# Patient Record
Sex: Female | Born: 1960 | Race: White | Hispanic: No | Marital: Married | Smoking: Never smoker
Health system: Southern US, Community
[De-identification: ages and names within clinical notes are randomized; demographics above are authoritative.]

## PROBLEM LIST (undated history)

## (undated) DIAGNOSIS — J45909 Unspecified asthma, uncomplicated: Secondary | ICD-10-CM

## (undated) DIAGNOSIS — J302 Other seasonal allergic rhinitis: Secondary | ICD-10-CM

## (undated) DIAGNOSIS — M1990 Unspecified osteoarthritis, unspecified site: Secondary | ICD-10-CM

## (undated) DIAGNOSIS — I10 Essential (primary) hypertension: Secondary | ICD-10-CM

## (undated) DIAGNOSIS — M1711 Unilateral primary osteoarthritis, right knee: Secondary | ICD-10-CM

## (undated) DIAGNOSIS — H269 Unspecified cataract: Secondary | ICD-10-CM

## (undated) HISTORY — PX: OTHER SURGICAL HISTORY: SHX169

## (undated) HISTORY — DX: Unspecified cataract: H26.9

## (undated) HISTORY — DX: Unspecified osteoarthritis, unspecified site: M19.90

## (undated) HISTORY — PX: TUBAL LIGATION: SHX77

## (undated) HISTORY — DX: Essential (primary) hypertension: I10

## (undated) HISTORY — DX: Unspecified asthma, uncomplicated: J45.909

## (undated) HISTORY — DX: Other seasonal allergic rhinitis: J30.2

## (undated) HISTORY — DX: Unilateral primary osteoarthritis, right knee: M17.11

---

## 2013-11-07 DIAGNOSIS — M1711 Unilateral primary osteoarthritis, right knee: Secondary | ICD-10-CM | POA: Insufficient documentation

## 2013-11-07 HISTORY — DX: Unilateral primary osteoarthritis, right knee: M17.11

## 2016-11-16 ENCOUNTER — Telehealth: Payer: Self-pay

## 2016-11-16 NOTE — Telephone Encounter (Signed)
Completed Pre-visit information with patient.

## 2016-11-16 NOTE — Telephone Encounter (Signed)
Pre visit call attempted. Left message for return call. 

## 2016-11-17 ENCOUNTER — Ambulatory Visit (INDEPENDENT_AMBULATORY_CARE_PROVIDER_SITE_OTHER): Payer: Self-pay | Admitting: Family Medicine

## 2016-11-17 ENCOUNTER — Encounter: Payer: Self-pay | Admitting: Family Medicine

## 2016-11-17 VITALS — BP 132/92 | HR 65 | Temp 98.9°F | Ht 62.0 in | Wt 177.2 lb

## 2016-11-17 DIAGNOSIS — R030 Elevated blood-pressure reading, without diagnosis of hypertension: Secondary | ICD-10-CM

## 2016-11-17 DIAGNOSIS — R079 Chest pain, unspecified: Secondary | ICD-10-CM

## 2016-11-17 NOTE — Progress Notes (Signed)
Chief Complaint  Patient presents with  . Establish Care    Monica Lynch is a 56 y.o. female here for evaluation of chest pain. She is a new patient. She has never seen a cardiologist.  Duration of issue: 2 months  Quality: sharp and pressure Palliation: She will take an asa when it happens; nothing in particular makes it better Provocation: Sometimes when she physically exerting herself; usually when she is doing nothing. Once it starts, nothing makes it worse. Severity: 8/10 Radiation: L arm Duration of chest pain: 2 minutes Associated symptoms: None Cardiac history: None that she is aware of Family heart history: mom, dad, brother died of MI at 8242 Smoker? No; exposed to 2nd hand smoke throughout childhood, people still smoke at work  ROS:  Cardiac: No current chest pain Lungs: No SOB  Past Medical History:  Diagnosis Date  . Asthma    osteoarthitis  . Cataract    current early stage   Family History  Problem Relation Age of Onset  . Heart disease Mother   . COPD Father    Social History   Social History  . Marital status: Married   Social History Main Topics  . Smoking status: Never Smoker  . Smokeless tobacco: Never Used  . Alcohol use Yes     Comment: occasion  . Drug use: No   Allergies as of 11/17/2016   No Known Allergies     Medication List    as of 11/17/2016  2:33 PM   You have not been prescribed any medications.          Discharge Care Instructions        Start     Ordered   11/17/16 0000  EKG 12-Lead     11/17/16 1413   11/17/16 0000  Ambulatory referral to Cardiology    Comments:  Prefers Fridays   11/17/16 1427      BP (!) 132/92 (BP Location: Left Arm, Patient Position: Sitting, Cuff Size: Normal)   Pulse 65   Temp 98.9 F (37.2 C) (Oral)   Ht 5\' 2"  (1.575 m)   Wt 177 lb 4 oz (80.4 kg)   SpO2 97%   BMI 32.42 kg/m  Gen: awake, alert, appears stated age HEENT: PERRLA, MMM Neck: No masses or asymmetry  Heart: RRR, no  bruits, no LE edema Lungs: CTAB, no accessory muscle use Abd: Soft, NT, ND, no masses or organomegaly MSK: chest pain is not reproducible to palptation Psych: Age appropriate judgment and insight, nml mood and affect  Chest pain, unspecified type - Plan: EKG 12-Lead, Ambulatory referral to Cardiology, CANCELED: Ambulatory referral to Cardiology  Elevated blood pressure reading  Orders as above. EKG nml. Refer to cardiology for hopeful stress test. Discussed s/s's where she should seek emergent care and provided in writing. DASH plan for BP. Pt due for health maintenance issues, but she does not have any insurance. Will address at future date.  F/u prn. The patient voiced understanding and agreement to the plan.  Jilda Rocheicholas Paul [place], DO 11/17/16 2:33 PM

## 2016-11-17 NOTE — Progress Notes (Signed)
Pre visit review using our clinic review tool, if applicable. No additional management support is needed unless otherwise documented below in the visit note. 

## 2016-11-17 NOTE — Patient Instructions (Addendum)
If you do not hear anything about your referral in the next week, call our office and ask for an update.  If the chest pain becomes more frequent, lasts longer (10-15 min), or you have new symptoms, consider calling 911 or going to ER.   Take an aspirin daily.   DASH Eating Plan DASH stands for "Dietary Approaches to Stop Hypertension." The DASH eating plan is a healthy eating plan that has been shown to reduce high blood pressure (hypertension). It may also reduce your risk for type 2 diabetes, heart disease, and stroke. The DASH eating plan may also help with weight loss. What are tips for following this plan? General guidelines  Avoid eating more than 2,300 mg (milligrams) of salt (sodium) a day. If you have hypertension, you may need to reduce your sodium intake to 1,500 mg a day.  Limit alcohol intake to no more than 1 drink a day for nonpregnant women and 2 drinks a day for men. One drink equals 12 oz of beer, 5 oz of wine, or 1 oz of hard liquor.  Work with your health care provider to maintain a healthy body weight or to lose weight. Ask what an ideal weight is for you.  Get at least 30 minutes of exercise that causes your heart to beat faster (aerobic exercise) most days of the week. Activities may include walking, swimming, or biking.  Work with your health care provider or diet and nutrition specialist (dietitian) to adjust your eating plan to your individual calorie needs. Reading food labels  Check food labels for the amount of sodium per serving. Choose foods with less than 5 percent of the Daily Value of sodium. Generally, foods with less than 300 mg of sodium per serving fit into this eating plan.  To find whole grains, look for the word "whole" as the first word in the ingredient list. Shopping  Buy products labeled as "low-sodium" or "no salt added."  Buy fresh foods. Avoid canned foods and premade or frozen meals. Cooking  Avoid adding salt when cooking. Use  salt-free seasonings or herbs instead of table salt or sea salt. Check with your health care provider or pharmacist before using salt substitutes.  Do not fry foods. Cook foods using healthy methods such as baking, boiling, grilling, and broiling instead.  Cook with heart-healthy oils, such as olive, canola, soybean, or sunflower oil. Meal planning   Eat a balanced diet that includes: ? 5 or more servings of fruits and vegetables each day. At each meal, try to fill half of your plate with fruits and vegetables. ? Up to 6-8 servings of whole grains each day. ? Less than 6 oz of lean meat, poultry, or fish each day. A 3-oz serving of meat is about the same size as a deck of cards. One egg equals 1 oz. ? 2 servings of low-fat dairy each day. ? A serving of nuts, seeds, or beans 5 times each week. ? Heart-healthy fats. Healthy fats called Omega-3 fatty acids are found in foods such as flaxseeds and coldwater fish, like sardines, salmon, and mackerel.  Limit how much you eat of the following: ? Canned or prepackaged foods. ? Food that is high in trans fat, such as fried foods. ? Food that is high in saturated fat, such as fatty meat. ? Sweets, desserts, sugary drinks, and other foods with added sugar. ? Full-fat dairy products.  Do not salt foods before eating.  Try to eat at least 2 vegetarian meals  each week.  Eat more home-cooked food and less restaurant, buffet, and fast food.  When eating at a restaurant, ask that your food be prepared with less salt or no salt, if possible. What foods are recommended? The items listed may not be a complete list. Talk with your dietitian about what dietary choices are best for you. Grains Whole-grain or whole-wheat bread. Whole-grain or whole-wheat pasta. Brown rice. Orpah Cobbatmeal. Quinoa. Bulgur. Whole-grain and low-sodium cereals. Pita bread. Low-fat, low-sodium crackers. Whole-wheat flour tortillas. Vegetables Fresh or frozen vegetables (raw, steamed,  roasted, or grilled). Low-sodium or reduced-sodium tomato and vegetable juice. Low-sodium or reduced-sodium tomato sauce and tomato paste. Low-sodium or reduced-sodium canned vegetables. Fruits All fresh, dried, or frozen fruit. Canned fruit in natural juice (without added sugar). Meat and other protein foods Skinless chicken or Malawiturkey. Ground chicken or Mal[place]iturkey. Pork with fat trimmed off. Fish and seafood. Egg whites. Dried beans, peas, or lentils. Unsalted nuts, nut butters, and seeds. Unsalted canned beans. Lean cuts of beef with fat trimmed off. Low-sodium, lean deli meat. Dairy Low-fat (1%) or fat-free (skim) milk. Fat-free, low-fat, or reduced-fat cheeses. Nonfat, low-sodium ricotta or cottage cheese. Low-fat or nonfat yogurt. Low-fat, low-sodium cheese. Fats and oils Soft margarine without trans fats. Vegetable oil. Low-fat, reduced-fat, or light mayonnaise and salad dressings (reduced-sodium). Canola, safflower, olive, soybean, and sunflower oils. Avocado. Seasoning and other foods Herbs. Spices. Seasoning mixes without salt. Unsalted popcorn and pretzels. Fat-free sweets. What foods are not recommended? The items listed may not be a complete list. Talk with your dietitian about what dietary choices are best for you. Grains Baked goods made with fat, such as croissants, muffins, or some breads. Dry pasta or rice meal packs. Vegetables Creamed or fried vegetables. Vegetables in a cheese sauce. Regular canned vegetables (not low-sodium or reduced-sodium). Regular canned tomato sauce and paste (not low-sodium or reduced-sodium). Regular tomato and vegetable juice (not low-sodium or reduced-sodium). Rosita FirePickles. Olives. Fruits Canned fruit in a light or heavy syrup. Fried fruit. Fruit in cream or butter sauce. Meat and other protein foods Fatty cuts of meat. Ribs. Fried meat. Tomasa BlaseBacon. Sausage. Bologna and other processed lunch meats. Salami. Fatback. Hotdogs. Bratwurst. Salted nuts and seeds. Canned  beans with added salt. Canned or smoked fish. Whole eggs or egg yolks. Chicken or Malawiturkey with skin. Dairy Whole or 2% milk, cream, and half-and-half. Whole or full-fat cream cheese. Whole-fat or sweetened yogurt. Full-fat cheese. Nondairy creamers. Whipped toppings. Processed cheese and cheese spreads. Fats and oils Butter. Stick margarine. Lard. Shortening. Ghee. Bacon fat. Tropical oils, such as coconut, palm kernel, or palm oil. Seasoning and other foods Salted popcorn and pretzels. Onion salt, garlic salt, seasoned salt, table salt, and sea salt. Worcestershire sauce. Tartar sauce. Barbecue sauce. Teriyaki sauce. Soy sauce, including reduced-sodium. Steak sauce. Canned and packaged gravies. Fish sauce. Oyster sauce. Cocktail sauce. Horseradish that you find on the shelf. Ketchup. Mustard. Meat flavorings and tenderizers. Bouillon cubes. Hot sauce and Tabasco sauce. Premade or packaged marinades. Premade or packaged taco seasonings. Relishes. Regular salad dressings. Where to find more information:  National Heart, Lung, and Blood Institute: PopSteam.iswww.nhlbi.nih.gov  American Heart Association: www.heart.org Summary  The DASH eating plan is a healthy eating plan that has been shown to reduce high blood pressure (hypertension). It may also reduce your risk for type 2 diabetes, heart disease, and stroke.  With the DASH eating plan, you should limit salt (sodium) intake to 2,300 mg a day. If you have hypertension, you may need to reduce  your sodium intake to 1,500 mg a day.  When on the DASH eating plan, aim to eat more fresh fruits and vegetables, whole grains, lean proteins, low-fat dairy, and heart-healthy fats.  Work with your health care provider or diet and nutrition specialist (dietitian) to adjust your eating plan to your individual calorie needs. This information is not intended to replace advice given to you by your health care provider. Make sure you discuss any questions you have with your  health care provider. Document Released: 02/16/2011 Document Revised: 02/21/2016 Document Reviewed: 02/21/2016 Elsevier Interactive Patient Education  2017 [employer].

## 2016-11-20 ENCOUNTER — Encounter: Payer: Self-pay | Admitting: Family Medicine

## 2016-12-01 ENCOUNTER — Encounter: Payer: Self-pay | Admitting: Cardiology

## 2016-12-01 ENCOUNTER — Ambulatory Visit (INDEPENDENT_AMBULATORY_CARE_PROVIDER_SITE_OTHER): Payer: Self-pay | Admitting: Cardiology

## 2016-12-01 ENCOUNTER — Other Ambulatory Visit: Payer: Self-pay

## 2016-12-01 ENCOUNTER — Telehealth: Payer: Self-pay

## 2016-12-01 DIAGNOSIS — R0609 Other forms of dyspnea: Secondary | ICD-10-CM | POA: Insufficient documentation

## 2016-12-01 DIAGNOSIS — I10 Essential (primary) hypertension: Secondary | ICD-10-CM

## 2016-12-01 DIAGNOSIS — R0789 Other chest pain: Secondary | ICD-10-CM | POA: Insufficient documentation

## 2016-12-01 HISTORY — DX: Essential (primary) hypertension: I10

## 2016-12-01 MED ORDER — ASPIRIN EC 81 MG PO TBEC: 81.0000 mg | DELAYED_RELEASE_TABLET | Freq: Every day | ORAL | 3 refills | Status: AC

## 2016-12-01 MED ORDER — NITROGLYCERIN 0.4 MG SL SUBL: 0.4000 mg | SUBLINGUAL_TABLET | SUBLINGUAL | 1 refills | Status: DC | PRN

## 2016-12-01 MED ORDER — METOPROLOL TARTRATE 25 MG PO TABS: 25.0000 mg | ORAL_TABLET | Freq: Two times a day (BID) | ORAL | 3 refills | Status: DC

## 2016-12-01 NOTE — Patient Instructions (Addendum)
Medication Instructions:  Your physician has recommended you make the following change in your medication:  1.) START taking metoprolol 25 mg twice daily.  2.) START taking Aspirin 81 mg daily.  3.) START Nitroglycerin 0.4 mg sublingual AS NEEDED for chest pain. The proper use and anticipated side effects of nitroglycerine has been carefully explained.  If a single episode of chest pain is not relieved by one tablet, the patient will try another within 5 minutes; and if this doesn't relieve the pain, the patient is instructed to call 911 for transportation to an emergency department.  Labwork: Your physician recommends that you return for a FASTING lipid profile: Today in office  Testing/Procedures: Your physician has requested that you have en exercise stress myoview. For further information please visit [email or web address] Please follow instruction sheet, as given.  Please report to [address], [address] [place], [place] the day of your testing.   Follow-Up: Your physician recommends that you schedule a follow-up appointment in: 1 month   Any Other Special Instructions Will Be Listed Below (If Applicable).  Please note that any paperwork needing to be filled out by the provider will need to be addressed at the front desk prior to seeing the provider. Please note that any paperwork FMLA, Disability or other documents regarding health condition is subject to a $25.00 charge that must be received prior to completion of paperwork in the form of a money order or check.     If you need a refill on your cardiac medications before your next appointment, please call your pharmacy.

## 2016-12-01 NOTE — Telephone Encounter (Signed)
patient pharmacy did not get lopressor please [email or web address]

## 2016-12-01 NOTE — Progress Notes (Signed)
Cardiology Consultation:    Date:  12/01/2016   ID:  Monica Lynch, DOB 09/19/1960, MRN [id number]  PCP:  Sharlene Dory, DO  Cardiologist:  Gypsy Balsam, MD   Referring MD: Sharlene Dory*   Chief Complaint  Patient presents with  . Chest Pain    Hits all of a sudden, feels like an elephant on her chest with arm pain/shoulder. No episodes since seeing PCP  I'm having chest  History of Present Illness:    Monica Lynch is a 56 y.o. female who is being seen today for the evaluation of chest pain at the request of Sharlene Dory*. For last few weeks she's being experiencing chest pain. She described chest pain as heaviness in the chest. It happens in different situations it can happen when she is laying down and sitting up will help. It also happens when she does things. However I cannot get clear-cut answe from her if it's inducible provoked by exercise. She tells me when she walks she does not pay much attention to her symptoms. There is no sweating there is no shortness of breath associated with it. I risk factors for coronary artery disease include family history of premature coronary artery disease, hypertension, cholesteol status is unknown. She never smoked but she is being exposed to second hand smoking for her life  Past Medical History:  Diagnosis Date  . Asthma   . Cataract    current early stage  . Essential hypertension 12/01/2016  . Osteoarthritis   . Seasonal allergies     Past Surgical History:  Procedure Laterality Date  . screws removed from ankles    . TUBAL LIGATION      Current Medications: No outpatient prescriptions have been marked as taking for the 12/01/16 encounter (Office Visit) with Georgeanna Lea, MD.     Allergies:   Patient has no known allergies.   Social History   Social History  . Marital status: Married    Spouse name: N/A  . Number of children: N/A  . Years of education: N/A   Social History  Main Topics  . Smoking status: Never Smoker  . Smokeless tobacco: Never Used  . Alcohol use Yes     Comment: occasional  . Drug use: No  . Sexual activity: Not Asked   Other Topics Concern  . None   Social History Narrative  . None     Family History: The patient's family history includes COPD in her father; Heart attack (age of onset: 34) in her brother; Heart disease in her mother; Hyperlipidemia in her father, maternal grandfather, and mother; Hypertension in her mother. ROS:   Please see the history of present illness.    All 14 point review of systems negative except as described per history of present illness.  EKGs/Labs/Other Studies Reviewed:    The following studies were reviewed today:   EKG:  EKG is  ordered today.  The ekg ordered today demonstrates   Recent Labs: No results found for requested labs within last 8760 hours.  Recent Lipid Panel No results found for: CHOL, TRIG, HDL, CHOLHDL, VLDL, LDLCALC, LDLDIRECT  Physical Exam:    VS:  BP (!) 136/94   Pulse 76   Resp 10   Ht  (1.575 m)   Wt 175 lb 6.4 oz (79.6 kg)   BMI 32.08 kg/m     Wt Readings from Last 3 Encounters:  12/01/16 175 lb 6.4 oz (79.6 kg)  11/17/16 177  lb 4 oz (80.4 kg)     GEN:  Well nourished, well developed in no acute distress HEENT: Normal NECK: No JVD; No carotid bruits LYMPHATICS: No lymphadenopathy CARDIAC: RRR, no murmurs, no rubs, no gallops RESPIRATORY:  Clear to auscultation without rales, wheezing or rhonchi  ABDOMEN: Soft, non-tender, non-distended MUSCULOSKELETAL:  No edema; No deformity  SKIN: Warm and dry NEUROLOGIC:  Alert and oriented x 3 PSYCHIATRIC:  Normal affect   ASSESSMENT:    1. Atypical chest pain   2. Essential hypertension   3. Dyspnea on exertion    PLAN:    In order of problems listed above:  1. Atypical chest pain: Will ask her to start taking one baby aspirin every single day,I will give her prescription for nitroglycerin and  instructions how to take it.I will also give her metoprolol 25 twice daily. She will be scheduled to  Have exercise Cardiolite. 2. Essential hypertension: She is being never diagnosed with it however she described to have elevated blood pressurehe time. I will give her metoprolol to help with her symptoms it should also help with her blood pressure. 3 Dyspnea with exertion: We'll do stress test to assess it 4. Her daughter Anselm Jungling is a Engineer, civil (consulting) maid some interesting observation, when patient was having chest pain she checked her pulse which was irregular. This is something that may need to be investigated ine future.   Medication Adjustments/Labs and Tests Ordered: Current medicines are reviewed at length with the patient today.  Concerns regarding medicines are outlined above.  No orders of the defined types were placed in this encounter.  No orders of the defined types were placed in this encounter.   Signed, Georgeanna Lea, MD, [facility]. 12/01/2016 9:17 AM    [facility]

## 2016-12-01 NOTE — Telephone Encounter (Signed)
Resent the medication

## 2016-12-02 LAB — LIPID PANEL
CHOL/HDL RATIO: 4.3 ratio (ref 0.0–4.4)
Cholesterol, Total: 213 mg/dL — ABNORMAL HIGH (ref 100–199)
HDL: 50 mg/dL (ref >39)
LDL Calculated: 137 mg/dL — ABNORMAL HIGH (ref 0–99)
Triglycerides: 129 mg/dL (ref 0–149)
VLDL CHOLESTEROL CAL: 26 mg/dL (ref 5–40)

## 2016-12-05 ENCOUNTER — Telehealth (HOSPITAL_COMMUNITY): Payer: Self-pay | Admitting: *Deleted

## 2016-12-05 NOTE — Telephone Encounter (Signed)
Left message on voicemail per DPR in reference to upcoming appointment scheduled on 12/08/16 at 0745 with detailed instructions given per Myocardial Perfusion Study Information Sheet for the test. LM to arrive 15 minutes early, and that it is imperative to arrive on time for appointment to keep from having the test rescheduled. If you need to cancel or reschedule your appointment, please call the office within 24 hours of your appointment. Failure to do so may result in a cancellation of your appointment, and a $50 no show fee. Phone number given for call back for any questions.

## 2016-12-08 ENCOUNTER — Other Ambulatory Visit: Payer: Self-pay

## 2016-12-08 ENCOUNTER — Emergency Department (HOSPITAL_COMMUNITY): Payer: Self-pay

## 2016-12-08 ENCOUNTER — Encounter (HOSPITAL_COMMUNITY): Payer: Self-pay | Admitting: *Deleted

## 2016-12-08 ENCOUNTER — Encounter (HOSPITAL_COMMUNITY): Payer: Self-pay

## 2016-12-08 ENCOUNTER — Other Ambulatory Visit (HOSPITAL_COMMUNITY): Payer: Self-pay

## 2016-12-08 ENCOUNTER — Ambulatory Visit (INDEPENDENT_AMBULATORY_CARE_PROVIDER_SITE_OTHER): Payer: Self-pay | Admitting: Internal Medicine

## 2016-12-08 ENCOUNTER — Inpatient Hospital Stay (HOSPITAL_COMMUNITY)
Admission: EM | Admit: 2016-12-08 | Discharge: 2016-12-17 | DRG: 234 | Disposition: A | Discharge status: Home / Self Care | Payer: Self-pay | Attending: Cardiothoracic Surgery | Admitting: Cardiothoracic Surgery

## 2016-12-08 ENCOUNTER — Ambulatory Visit (HOSPITAL_COMMUNITY): Payer: Self-pay | Attending: Cardiovascular Disease

## 2016-12-08 VITALS — BP 132/92 | HR 62 | Ht 62.0 in | Wt 175.0 lb

## 2016-12-08 DIAGNOSIS — R0789 Other chest pain: Secondary | ICD-10-CM | POA: Insufficient documentation

## 2016-12-08 DIAGNOSIS — Z01818 Encounter for other preprocedural examination: Secondary | ICD-10-CM

## 2016-12-08 DIAGNOSIS — R0609 Other forms of dyspnea: Secondary | ICD-10-CM | POA: Insufficient documentation

## 2016-12-08 DIAGNOSIS — R9439 Abnormal result of other cardiovascular function study: Secondary | ICD-10-CM

## 2016-12-08 DIAGNOSIS — I10 Essential (primary) hypertension: Secondary | ICD-10-CM | POA: Diagnosis present

## 2016-12-08 DIAGNOSIS — E8770 Fluid overload, unspecified: Secondary | ICD-10-CM | POA: Diagnosis not present

## 2016-12-08 DIAGNOSIS — E669 Obesity, unspecified: Secondary | ICD-10-CM | POA: Diagnosis present

## 2016-12-08 DIAGNOSIS — Z7982 Long term (current) use of aspirin: Secondary | ICD-10-CM

## 2016-12-08 DIAGNOSIS — M1990 Unspecified osteoarthritis, unspecified site: Secondary | ICD-10-CM | POA: Diagnosis present

## 2016-12-08 DIAGNOSIS — R001 Bradycardia, unspecified: Secondary | ICD-10-CM | POA: Diagnosis not present

## 2016-12-08 DIAGNOSIS — Z79899 Other long term (current) drug therapy: Secondary | ICD-10-CM

## 2016-12-08 DIAGNOSIS — Z951 Presence of aortocoronary bypass graft: Secondary | ICD-10-CM

## 2016-12-08 DIAGNOSIS — I472 Ventricular tachycardia: Secondary | ICD-10-CM | POA: Diagnosis present

## 2016-12-08 DIAGNOSIS — I200 Unstable angina: Secondary | ICD-10-CM | POA: Diagnosis present

## 2016-12-08 DIAGNOSIS — I2510 Atherosclerotic heart disease of native coronary artery without angina pectoris: Secondary | ICD-10-CM | POA: Diagnosis present

## 2016-12-08 DIAGNOSIS — I25110 Atherosclerotic heart disease of native coronary artery with unstable angina pectoris: Principal | ICD-10-CM | POA: Diagnosis present

## 2016-12-08 DIAGNOSIS — Z8249 Family history of ischemic heart disease and other diseases of the circulatory system: Secondary | ICD-10-CM

## 2016-12-08 DIAGNOSIS — I209 Angina pectoris, unspecified: Secondary | ICD-10-CM

## 2016-12-08 DIAGNOSIS — Z6831 Body mass index (BMI) 31.0-31.9, adult: Secondary | ICD-10-CM

## 2016-12-08 DIAGNOSIS — I208 Other forms of angina pectoris: Secondary | ICD-10-CM

## 2016-12-08 DIAGNOSIS — D62 Acute posthemorrhagic anemia: Secondary | ICD-10-CM | POA: Diagnosis not present

## 2016-12-08 LAB — MYOCARDIAL PERFUSION IMAGING
CHL CUP MPHR: 166 {beats}/min
CHL CUP STRESS STAGE 1 DBP: 92 mmHg
CHL CUP STRESS STAGE 1 GRADE: 0 %
CHL CUP STRESS STAGE 1 HR: 80 {beats}/min
CHL CUP STRESS STAGE 1 SBP: 132 mmHg
CHL CUP STRESS STAGE 2 DBP: 92 mmHg
CHL CUP STRESS STAGE 3 GRADE: 0.1 %
CHL CUP STRESS STAGE 4 GRADE: 10 %
CHL CUP STRESS STAGE 4 HR: 123 {beats}/min
CHL CUP STRESS STAGE 5 GRADE: 12 %
CHL CUP STRESS STAGE 5 HR: 142 {beats}/min
CHL CUP STRESS STAGE 5 SPEED: 2.5 mph
CHL CUP STRESS STAGE 6 DBP: 76 mmHg
CHL CUP STRESS STAGE 6 HR: 96 {beats}/min
CHL CUP STRESS STAGE 6 SBP: 90 mmHg
CHL CUP STRESS STAGE 6 SPEED: 0 mph
CHL CUP STRESS STAGE 7 SPEED: 0 mph
CSEPED: 5 min
CSEPEW: 7 METS
CSEPHR: 87 %
CSEPPHR: 142 {beats}/min
Exercise duration (sec): 47 s
LVDIAVOL: 104 mL (ref 46–106)
LVSYSVOL: 46 mL
Peak BP: 127 mmHg
Percent of predicted max HR: 85 %
RATE: 0.47
Rest HR: 61 {beats}/min
SDS: 10
SRS: 3
SSS: 13
Stage 1 Speed: 0 mph
Stage 2 Grade: 0 %
Stage 2 HR: 65 {beats}/min
Stage 2 SBP: 138 mmHg
Stage 2 Speed: 0 mph
Stage 3 HR: 65 {beats}/min
Stage 3 Speed: 0 mph
Stage 4 Speed: 1.7 mph
Stage 5 DBP: 87 mmHg
Stage 5 SBP: 127 mmHg
Stage 6 Grade: 0 %
Stage 7 Grade: 0 %
Stage 7 HR: 62 {beats}/min
TID: 1.25

## 2016-12-08 LAB — TROPONIN I

## 2016-12-08 LAB — CBC WITH DIFFERENTIAL/PLATELET
BASOS PCT: 0 %
Basophils Absolute: 0 10*3/uL (ref 0.0–0.1)
EOS ABS: 0.1 10*3/uL (ref 0.0–0.7)
Eosinophils Relative: 1 %
HCT: 42.1 % (ref 36.0–46.0)
HEMOGLOBIN: 14.3 g/dL (ref 12.0–15.0)
Lymphocytes Relative: 26 %
Lymphs Abs: 2 10*3/uL (ref 0.7–4.0)
MCH: 30.8 pg (ref 26.0–34.0)
MCHC: 34 g/dL (ref 30.0–36.0)
MCV: 90.5 fL (ref 78.0–100.0)
Monocytes Absolute: 0.3 10*3/uL (ref 0.1–1.0)
Monocytes Relative: 4 %
NEUTROS PCT: 69 %
Neutro Abs: 5.5 10*3/uL (ref 1.7–7.7)
PLATELETS: 279 10*3/uL (ref 150–400)
RBC: 4.65 MIL/uL (ref 3.87–5.11)
RDW: 12.4 % (ref 11.5–15.5)
WBC: 8 10*3/uL (ref 4.0–10.5)

## 2016-12-08 LAB — COMPREHENSIVE METABOLIC PANEL
ALK PHOS: 52 U/L (ref 38–126)
ALT: 15 U/L (ref 14–54)
ANION GAP: 8 (ref 5–15)
AST: 18 U/L (ref 15–41)
Albumin: 4.2 g/dL (ref 3.5–5.0)
BILIRUBIN TOTAL: 0.7 mg/dL (ref 0.3–1.2)
BUN: 10 mg/dL (ref 6–20)
CALCIUM: 9.3 mg/dL (ref 8.9–10.3)
CO2: 23 mmol/L (ref 22–32)
Chloride: 108 mmol/L (ref 101–111)
Creatinine, Ser: 0.61 mg/dL (ref 0.44–1.00)
Glucose, Bld: 85 mg/dL (ref 65–99)
Potassium: 3.9 mmol/L (ref 3.5–5.1)
Sodium: 139 mmol/L (ref 135–145)
TOTAL PROTEIN: 7.3 g/dL (ref 6.5–8.1)

## 2016-12-08 LAB — ABO/RH: ABO/RH(D): B POS

## 2016-12-08 LAB — TYPE AND SCREEN
ABO/RH(D): B POS
ANTIBODY SCREEN: NEGATIVE

## 2016-12-08 LAB — PROTIME-INR
INR: 0.99
PROTHROMBIN TIME: 13 s (ref 11.4–15.2)

## 2016-12-08 LAB — HEPARIN LEVEL (UNFRACTIONATED): Heparin Unfractionated: 0.29 IU/mL — ABNORMAL LOW (ref 0.30–0.70)

## 2016-12-08 MED ORDER — HEPARIN (PORCINE) IN NACL 100-0.45 UNIT/ML-% IJ SOLN
1050.0000 [IU]/h | INTRAMUSCULAR | Status: DC
  Administered 2016-12-08: 900 [IU]/h via INTRAVENOUS
  Administered 2016-12-09: 1050 [IU]/h via INTRAVENOUS
  Filled 2016-12-08 (×3): qty 250

## 2016-12-08 MED ORDER — HEPARIN BOLUS VIA INFUSION
4000.0000 [IU] | Freq: Once | INTRAVENOUS | Status: AC
  Administered 2016-12-08: 4000 [IU] via INTRAVENOUS
  Filled 2016-12-08: qty 4000

## 2016-12-08 MED ORDER — TECHNETIUM TC 99M TETROFOSMIN IV KIT
10.1000 | PACK | Freq: Once | INTRAVENOUS | Status: AC | PRN
  Administered 2016-12-08: 10.1 via INTRAVENOUS
  Filled 2016-12-08: qty 11

## 2016-12-08 MED ORDER — TECHNETIUM TC 99M TETROFOSMIN IV KIT
30.6000 | PACK | Freq: Once | INTRAVENOUS | Status: AC | PRN
  Administered 2016-12-08: 30.6 via INTRAVENOUS
  Filled 2016-12-08: qty 31

## 2016-12-08 NOTE — ED Notes (Signed)
Patient denies Chest pain and SOB and is resting comfortably. NAD noted.  Family is at bedside.  Remains on heparin gtts and is infusing well.  Will continue to monitor for any needs or changes

## 2016-12-08 NOTE — ED Provider Notes (Signed)
Dr. Sherryl Manges has called regarding patient management. Patient had abnormal stress test today. She is sent to the emergency department with planned admission to cardiology. Patient is to be started on ACS heparin.   Arby Barrette, MD 12/08/16 [phone]

## 2016-12-08 NOTE — ED Provider Notes (Signed)
Medical screening examination/treatment/procedure(s) were conducted as a shared visit with non-physician practitioner(s) and myself.  I personally evaluated the patient during the encounter.   EKG Interpretation  Date/Time:  Friday December 08 2016 12:13:45 EDT Ventricular Rate:  61 PR Interval:  160 QRS Duration: 80 QT Interval:  406 QTC Calculation: 408 R Axis:   83 Text Interpretation:  Normal sinus rhythm Normal ECG no acute changes  Confirmed by [name] [phone]) on 12/08/2016 1:05:61 PM      56 year old female with history of HTN who presents with abnormal stress test. Reports intermittent chest pressure, dyspnea at rest and with activity for past few months. Seen by cardiology for stress ECHO today, and abnormal. Sent by Dr. Graciela Husbands to ED for cardiology admission and LHC. Denies any chest pain currently.  Well appearing. No acute distress. Vital signs normal. EKG normal. Troponin normal. Started on heparin drip. Cardiology consulted for admission.    [name], MD 12/08/16 [phone]

## 2016-12-08 NOTE — Progress Notes (Signed)
ANTICOAGULATION CONSULT NOTE - Initial Consult  Pharmacy Consult for heparin Indication: chest pain/ACS  Allergies  Allergen Reactions  . Tape Other (See Comments)    blister    Patient Measurements:   Heparin Dosing Weight: 67.7kg  Vital Signs: Temp: 98.7 F (37.1 C) (09/28 1210) Temp Source: Oral (09/28 1210) BP: 146/90 (09/28 1210) Pulse Rate: 64 (09/28 1210)  Labs: No results for input(s): HGB, HCT, PLT, APTT, LABPROT, INR, HEPARINUNFRC, HEPRLOWMOCWT, CREATININE, CKTOTAL, CKMB, TROPONINI in the last 72 hours.  CrCl cannot be calculated (No order found.).   Medical History: Past Medical History:  Diagnosis Date  . Asthma   . Cataract    current early stage  . Essential hypertension 12/01/2016  . Osteoarthritis   . Seasonal allergies     Medications:  Infusions:  . heparin      Assessment: 56 yof presented to the ED after an abnormal stress test. To start IV heparin. Baseline CBC is WNL and she is not on anticoagulation PTA.   Goal of Therapy:  Heparin level 0.3-0.7 units/ml Monitor platelets by anticoagulation protocol: Yes   Plan:  Heparin bolus 4000 units IV x 1 Heparin gtt 900 units/hr Check a 6 hr heparin level Daily heparin level and CBC  Monica Lynch, Drake Leach 12/08/2016,12:33 PM

## 2016-12-08 NOTE — ED Triage Notes (Signed)
Pt was having intermittent chest pain and sob that was infrequent, went to PMD, then Cardiologist, then had stress test today with abnormality.  Pt to have heart cath today.

## 2016-12-08 NOTE — Progress Notes (Signed)
Patient Care Team: Sharlene Dory, DO as PCP - General (Family Medicine)   HPI  Monica Lynch is a 56 y.o. female Was referred for Myoview scanning today because of complaints of shortness of breath and chest discomfort and worsening peripheral edema. These have been going on for last couple of months.  She has not had peripheral edema she's had not had nocturnal dyspnea or palpitations.  She does not smoke.  From family history of coronary artery disease as well has history of hypertension-treated  Records and Results Reviewed  Past Medical History:  Diagnosis Date  . Asthma   . Cataract    current early stage  . Essential hypertension 12/01/2016  . Osteoarthritis   . Seasonal allergies     Past Surgical History:  Procedure Laterality Date  . screws removed from ankles    . TUBAL LIGATION      No current facility-administered medications for this visit.    Current Outpatient Prescriptions  Medication Sig Dispense Refill  . aspirin EC 81 MG tablet Take 1 tablet (81 mg total) by mouth daily. 90 tablet 3  . metoprolol tartrate (LOPRESSOR) 25 MG tablet Take 1 tablet (25 mg total) by mouth 2 (two) times daily. 60 tablet 3  . nitroGLYCERIN (NITROSTAT) 0.4 MG SL tablet Place 1 tablet (0.4 mg total) under the tongue every 5 (five) minutes as needed for chest pain. 25 tablet 1  . acetaminophen (TYLENOL) 500 MG tablet Take 500 mg by mouth every 6 (six) hours as needed for headache.     Facility-Administered Medications Ordered in Other Visits  Medication Dose Route Frequency Provider Last Rate Last Dose  . heparin ADULT infusion 100 units/mL (25000 units/253mL sodium chloride 0.45%)  900 Units/hr Intravenous Continuous Rumbarger, Rachel L, RPH 9 mL/hr at 12/08/16 1313 900 Units/hr at 12/08/16 1313    Allergies  Allergen Reactions  . Tape Other (See Comments)    blister   Family History  Problem Relation Age of Onset  . Heart disease Mother   .  Hypertension Mother   . Hyperlipidemia Mother   . COPD Father   . Hyperlipidemia Father   . Heart attack Brother 42  . Hyperlipidemia Maternal Grandfather    Social History  Substance Use Topics  . Smoking status: Never Smoker  . Smokeless tobacco: Never Used  . Alcohol use Yes     Comment: occasional       Review of Systems negative except from HPI and PMH  Physical Exam BP (!) 132/92   Pulse 62   Ht  (1.575 m)   Wt 175 lb (79.4 kg)   BMI 32.01 kg/m  Well developed and nourished in no acute distress HENT normal Neck supple with JVP-flat Carotids brisk and full without bruits Clear Regular rate and rhythm, no murmurs or gallops Abd-soft with active BS without hepatomegaly No Clubbing cyanosis edema Skin-warm and dry A & Oriented  Grossly normal sensory and motor function   ECG withou ST-T changes  Assessment and  Plan Angina pectoris-stable  Abnormal nuclear stress test  Dyspnea on exertion  Hyperlipidemia  Hypertension  Sleep disordered breathing  The patient has a markedly abnormal stress test with ST changes as well as redistribution. There are some baseline perfusion abnormalities. She will need catheterization. She's had symptoms both at rest and with exertion but they're relatively stable.  There was a significant decrease in blood pressure with exercise concerning for left main disease. Hence, she will  be admitted to hospital with heparinization.  We will arrange catheterization early next week.  She likely would also benefit from an outpatient sleep study  Current medicines are reviewed at length with the patient today .  The patient does not  have concerns regarding medicines.

## 2016-12-08 NOTE — ED Notes (Signed)
ED Provider at bedside. 

## 2016-12-08 NOTE — ED Provider Notes (Signed)
[facility]-EMERGENCY DEPT Provider Note   CSN: [id number] Arrival date & time: 12/08/16  1152     History   Chief Complaint Chief Complaint  Patient presents with  . Chest Pain    HPI  Monica Lynch is a 56 y.o. Female with a history of hypertension, asthma, and intermittent chest pains, who presents today for cardiac catheterization after a positive stress test today. Patient reports over the past several months she's been having intermittent episodes of chest pain and shortness of breath. She describes chest pain as intense chest pressure, like she has a 100 pound weight sitting on her chest. This pain radiates up the left side of her neck as well as about halfway down her left arm and she has associated shortness of breath. She has these pains intermittently both at rest and with exertion, after a few minutes they resolve on their own. Prior to her stress test today, her last episode of chest pain like this was about a month ago. Patient denies any associated diaphoresis, nausea, vomiting, dizziness. Her stress test today was resulted as a high risk stress nuclear study with extensive inferior and inferolateral ischemia, almost entirely reversible. Patient is currently symptom-free, denies any chest pain or shortness of breath. No history of previous MI, no history of tobacco use.      Past Medical History:  Diagnosis Date  . Asthma   . Cataract    current early stage  . Essential hypertension 12/01/2016  . Osteoarthritis   . Seasonal allergies     Patient Active Problem List   Diagnosis Date Noted  . Atypical chest pain 12/01/2016  . Essential hypertension 12/01/2016  . Dyspnea on exertion 12/01/2016    Past Surgical History:  Procedure Laterality Date  . screws removed from ankles    . TUBAL LIGATION      OB History    No data available       Home Medications    Prior to Admission medications   Medication Sig Start Date End Date Taking? Authorizing Provider    aspirin EC 81 MG tablet Take 1 tablet (81 mg total) by mouth daily. 12/01/16   Georgeanna Lea, MD  metoprolol tartrate (LOPRESSOR) 25 MG tablet Take 1 tablet (25 mg total) by mouth 2 (two) times daily. 12/01/16 03/01/17  Georgeanna Lea, MD  nitroGLYCERIN (NITROSTAT) 0.4 MG SL tablet Place 1 tablet (0.4 mg total) under the tongue every 5 (five) minutes as needed for chest pain. 12/01/16 03/01/17  Georgeanna Lea, MD    Family History Family History  Problem Relation Age of Onset  . Heart disease Mother   . Hypertension Mother   . Hyperlipidemia Mother   . COPD Father   . Hyperlipidemia Father   . Heart attack Brother 42  . Hyperlipidemia Maternal Grandfather     Social History Social History  Substance Use Topics  . Smoking status: Never Smoker  . Smokeless tobacco: Never Used  . Alcohol use Yes     Comment: occasional     Allergies   Tape   Review of Systems Review of Systems  Constitutional: Negative for chills and fever.  HENT: Negative for congestion, rhinorrhea and sore throat.   Eyes: Negative for photophobia and visual disturbance.  Respiratory: Positive for shortness of breath. Negative for cough and chest tightness.   Cardiovascular: Positive for chest pain. Negative for palpitations and leg swelling.  Genitourinary: Negative for dysuria.  Musculoskeletal: Negative for myalgias.  Skin: Negative for pallor  and rash.  Neurological: Negative for dizziness, weakness, light-headedness, numbness and headaches.     Physical Exam Updated Vital Signs BP (!) 146/90 (BP Location: Left Arm)   Pulse 64   Temp 98.7 F (37.1 C) (Oral)   Resp 16   SpO2 98%   Physical Exam  Constitutional: She appears well-developed and well-nourished. No distress.  HENT:  Head: Normocephalic and atraumatic.  Eyes: Pupils are equal, round, and reactive to light. EOM are normal. Right eye exhibits no discharge. Left eye exhibits no discharge.  Neck: Neck supple.   Cardiovascular: Normal rate, regular rhythm, normal heart sounds and intact distal pulses.   Pulmonary/Chest: Effort normal and breath sounds normal. No respiratory distress. She has no wheezes. She has no rales. She exhibits no tenderness.  Abdominal: Soft. Bowel sounds are normal. She exhibits no distension and no mass. There is no tenderness. There is no guarding.  Musculoskeletal: She exhibits no edema or deformity.  Neurological: She is alert. Coordination normal.  Speech is clear, able to follow commands CN III-XII intact Normal strength in upper and lower extremities bilaterally including dorsiflexion and plantar flexion, strong and equal grip strength Sensation normal to light and sharp touch Moves extremities without ataxia, coordination intact  Skin: Skin is warm and dry. Capillary refill takes less than 2 seconds. She is not diaphoretic.  Psychiatric: She has a normal mood and affect. Her behavior is normal.  Nursing note and vitals reviewed.    ED Treatments / Results  Labs (all labs ordered are listed, but only abnormal results are displayed) Labs Reviewed  COMPREHENSIVE METABOLIC PANEL  TROPONIN I  CBC WITH DIFFERENTIAL/PLATELET  PROTIME-INR  HEPARIN LEVEL (UNFRACTIONATED)  TYPE AND SCREEN  ABO/RH    EKG  EKG Interpretation  Date/Time:  Friday December 08 2016 12:13:45 EDT Ventricular Rate:  61 PR Interval:  160 QRS Duration: 80 QT Interval:  406 QTC Calculation: 408 R Axis:   83 Text Interpretation:  Normal sinus rhythm Normal ECG no acute changes  Confirmed by [name] [phone]) on 12/08/2016 1:05:42 PM       Radiology Dg Chest Port 1 View  Result Date: 12/08/2016 CLINICAL DATA:  Intermittent chest pain. EXAM: PORTABLE CHEST 1 VIEW COMPARISON:  None. FINDINGS: Lungs are clear. Heart and mediastinum are within normal limits. The trachea is midline. No large pleural effusions. Negative for a pneumothorax. Bony thorax is intact. IMPRESSION: No active  disease. Electronically Signed   By: Richarda Overlie M.D.   On: 12/08/2016 13:24    Procedures Procedures (including critical care time)  Medications Ordered in ED Medications  heparin ADULT infusion 100 units/mL (25000 units/265mL sodium chloride 0.45%) (900 Units/hr Intravenous New Bag/Given 12/08/16 1313)  heparin bolus via infusion 4,000 Units (4,000 Units Intravenous Bolus from Bag 12/08/16 1314)     Initial Impression / Assessment and Plan / ED Course  I have reviewed the triage vital signs and the nursing notes.  Pertinent labs & imaging results that were available during my care of the patient were reviewed by me and considered in my medical decision making (see chart for details).   Patient sent from Dr. Odessa Fleming office for cardiac catheterization after positive stress test today with ST depressions in the inferior and inferolateral leads. Vitals normal and patient in NAD and currently symptom free. Labs unremarkable, troponin neg, EKG and CXR normal. Pt started on heparin gtt. Cardiology consulted for admission and left heart cath.  Patient discussed with Dr. [name], who saw patient as  well and agrees with plan.    Final Clinical Impressions(s) / ED Diagnoses   Final diagnoses:  Abnormal stress test  Angina at rest [facility])    New Prescriptions New Prescriptions   No medications on file     Legrand Rams 12/08/16 1731    [name], MD 12/14/16 [phone]

## 2016-12-08 NOTE — ED Notes (Signed)
Attempted report.  Charge nurse just assigned room to nurse.  Will return call

## 2016-12-08 NOTE — Progress Notes (Signed)
ANTICOAGULATION CONSULT NOTE  Pharmacy Consult for heparin Indication: chest pain/ACS  Allergies  Allergen Reactions  . Tape Other (See Comments)    blister    Patient Measurements: Height:  (157.5 cm) Weight: 175 lb (79.4 kg) IBW/kg (Calculated) : 50.1 Heparin Dosing Weight: 67.7kg  Vital Signs: Temp: 98.7 F (37.1 C) (09/28 1210) Temp Source: Oral (09/28 1210) BP: 162/80 (09/28 2130) Pulse Rate: 56 (09/28 2130)  Labs:  Recent Labs  12/08/16 1230 12/08/16 2130  HGB 14.3  --   HCT 42.1  --   PLT 279  --   LABPROT 13.0  --   INR 0.99  --   HEPARINUNFRC  --  0.29*  CREATININE 0.61  --   TROPONINI <0.03  --     Estimated Creatinine Clearance: 78.4 mL/min (by C-G formula based on SCr of 0.61 mg/dL).   Medical History: Past Medical History:  Diagnosis Date  . Asthma   . Cataract    current early stage  . Essential hypertension 12/01/2016  . Osteoarthritis   . Seasonal allergies     Medications:  Infusions:  . heparin 900 Units/hr (12/08/16 1313)    Assessment: 56 yof presented to the ED after an abnormal stress test. To start IV heparin. Baseline CBC is WNL and she is not on anticoagulation PTA.   HL is slightly subtherapeutic, will increase rate.  Goal of Therapy:  Heparin level 0.3-0.7 units/ml Monitor platelets by anticoagulation protocol: Yes   Plan:  Increase heparin to 1050 units/hr Daily HL, CBC  Malachai Schalk, Darl Householder 12/08/2016,10:39 PM

## 2016-12-08 NOTE — ED Notes (Signed)
Pt placed on bedside monitoring and EKG completed

## 2016-12-08 NOTE — Patient Instructions (Signed)
Medication Instructions: - Your physician recommends that you continue on your current medications as directed. Please refer to the Current Medication list given to you today.  Labwork: - none ordered  Procedures/Testing: - none ordered  Follow-Up: - You are being sent directly to the [facility] ER for plans for admission.  Any Additional Special Instructions Will Be Listed Below (If Applicable).     If you need a refill on your cardiac medications before your next appointment, please call your pharmacy.

## 2016-12-09 ENCOUNTER — Telehealth (HOSPITAL_COMMUNITY): Payer: Self-pay

## 2016-12-09 DIAGNOSIS — I200 Unstable angina: Secondary | ICD-10-CM | POA: Diagnosis present

## 2016-12-09 LAB — BASIC METABOLIC PANEL
Anion gap: 9 (ref 5–15)
BUN: 8 mg/dL (ref 6–20)
CO2: 24 mmol/L (ref 22–32)
Calcium: 8.8 mg/dL — ABNORMAL LOW (ref 8.9–10.3)
Chloride: 106 mmol/L (ref 101–111)
Creatinine, Ser: 0.59 mg/dL (ref 0.44–1.00)
GFR calc Af Amer: >60 mL/min (ref >60)
GFR calc non Af Amer: >60 mL/min (ref >60)
Glucose, Bld: 95 mg/dL (ref 65–99)
Potassium: 3.5 mmol/L (ref 3.5–5.1)
Sodium: 139 mmol/L (ref 135–145)

## 2016-12-09 LAB — CBC
HCT: 38.9 % (ref 36.0–46.0)
Hemoglobin: 13 g/dL (ref 12.0–15.0)
MCH: 30.4 pg (ref 26.0–34.0)
MCHC: 33.4 g/dL (ref 30.0–36.0)
MCV: 90.9 fL (ref 78.0–100.0)
PLATELETS: 269 10*3/uL (ref 150–400)
RBC: 4.28 MIL/uL (ref 3.87–5.11)
RDW: 12.5 % (ref 11.5–15.5)
WBC: 7 10*3/uL (ref 4.0–10.5)

## 2016-12-09 LAB — HIV ANTIBODY (ROUTINE TESTING W REFLEX): HIV Screen 4th Generation wRfx: NONREACTIVE

## 2016-12-09 LAB — HEPARIN LEVEL (UNFRACTIONATED): HEPARIN UNFRACTIONATED: 0.39 [IU]/mL (ref 0.30–0.70)

## 2016-12-09 MED ORDER — NITROGLYCERIN 0.4 MG SL SUBL
0.4000 mg | SUBLINGUAL_TABLET | SUBLINGUAL | Status: DC | PRN
Start: 2016-12-09 — End: 2016-12-12

## 2016-12-09 MED ORDER — ONDANSETRON HCL 4 MG/2ML IJ SOLN: 4.0000 mg | Freq: Four times a day (QID) | INTRAMUSCULAR | Status: DC | PRN

## 2016-12-09 MED ORDER — ACETAMINOPHEN 500 MG PO TABS
500.0000 mg | ORAL_TABLET | Freq: Four times a day (QID) | ORAL | Status: DC | PRN
  Administered 2016-12-11: 500 mg via ORAL
  Filled 2016-12-09: qty 1

## 2016-12-09 MED ORDER — INFLUENZA VAC SPLIT QUAD 0.5 ML IM SUSY: 0.5000 mL | PREFILLED_SYRINGE | INTRAMUSCULAR | Status: DC

## 2016-12-09 MED ORDER — METOPROLOL TARTRATE 25 MG PO TABS
25.0000 mg | ORAL_TABLET | Freq: Two times a day (BID) | ORAL | Status: DC
  Administered 2016-12-09 – 2016-12-11 (×6): 25 mg via ORAL
  Filled 2016-12-09 (×6): qty 1

## 2016-12-09 MED ORDER — ATORVASTATIN CALCIUM 80 MG PO TABS
80.0000 mg | ORAL_TABLET | Freq: Every day | ORAL | Status: DC
  Administered 2016-12-09 – 2016-12-16 (×7): 80 mg via ORAL
  Filled 2016-12-09 (×8): qty 1

## 2016-12-09 MED ORDER — ASPIRIN EC 81 MG PO TBEC
81.0000 mg | DELAYED_RELEASE_TABLET | Freq: Every day | ORAL | Status: DC
  Administered 2016-12-09 – 2016-12-10 (×2): 81 mg via ORAL
  Filled 2016-12-09 (×2): qty 1

## 2016-12-09 NOTE — Progress Notes (Signed)
Subjective:  Admitted out of the office through the emergency room yesterday following an abnormal stress test.  No chest pain suggestive of angina at the present time.  Objective:  Vital Signs in the last 24 hours: BP (!) 141/82 (BP Location: Right Arm)   Pulse (!) 52   Temp 98.2 F (36.8 C) (Oral)   Resp 17   Ht  (1.575 m)   Wt 78.5 kg (173 lb)   SpO2 99%   BMI 31.64 kg/m   Physical Exam: mildly obese female in no acute distress Lungs:  Clear Cardiac:  Regular rhythm, normal S1 and S2, no S3 Extremities:  No edema present  Intake/Output from previous day: 09/28 0701 - 09/29 0700 In: 171.8 [I.V.:171.8] Out: -   Weight Filed Weights   12/08/16 1315 12/09/16 0003 12/09/16 0500  Weight: 79.4 kg (175 lb) 78.6 kg (173 lb 4.5 oz) 78.5 kg (173 lb)    Lab Results: Basic Metabolic Panel:  Recent Labs  16/10/96 1230 12/09/16 0537  NA 139 139  K 3.9 3.5  CL 108 106  CO2 23 24  GLUCOSE 85 95  BUN 10 8  CREATININE 0.61 0.59   CBC:  Recent Labs  12/08/16 1230 12/09/16 0537  WBC 8.0 7.0  NEUTROABS 5.5  --   HGB 14.3 13.0  HCT 42.1 38.9  MCV 90.5 90.9  PLT 279 269   Cardiac Panel (last 3 results)  Recent Labs  12/08/16 1230  TROPONINI <0.03    Telemetry: Sinus rhythm personally reviewed  Assessment/Plan:  1.  Progressive angina with an abnormal stress test 2.  Hypertension  Recommendations:  Catheterization planned for Monday.  Procedure again discussed with patient.  Continue heparin.    Darden Palmer  MD [facility] Card[facility]  12/09/2016, 9:58 AM

## 2016-12-09 NOTE — H&P (Signed)
Seen in office yday with abnormal stress test and Hypotension with GXT 169>>120 Admitted  See progress note

## 2016-12-09 NOTE — Progress Notes (Signed)
ANTICOAGULATION CONSULT NOTE  Pharmacy Consult for heparin Indication: chest pain/ACS  Allergies  Allergen Reactions  . Tape Other (See Comments)    blister    Patient Measurements: Height:  (157.5 cm) Weight: 173 lb (78.5 kg) IBW/kg (Calculated) : 50.1 Heparin Dosing Weight: 67.7kg  Vital Signs: Temp: 98 F (36.7 C) (09/29 0520) Temp Source: Oral (09/29 0520) BP: 133/64 (09/29 0520) Pulse Rate: 52 (09/29 0520)  Labs:  Recent Labs  12/08/16 1230 12/08/16 2130 12/09/16 0537  HGB 14.3  --  13.0  HCT 42.1  --  38.9  PLT 279  --  269  LABPROT 13.0  --   --   INR 0.99  --   --   HEPARINUNFRC  --  0.29* 0.39  CREATININE 0.61  --  0.59  TROPONINI <0.03  --   --     Estimated Creatinine Clearance: 78 mL/min (by C-G formula based on SCr of 0.59 mg/dL).   Medical History: Past Medical History:  Diagnosis Date  . Asthma   . Cataract    current early stage  . Essential hypertension 12/01/2016  . Osteoarthritis   . Seasonal allergies     Medications:  Infusions:  . heparin 1,050 Units/hr (12/09/16 0518)    Assessment: 56 yof presented to the ED after an abnormal stress test. Started on IV heparin for ACS. Baseline CBC is WNL and she is not on anticoagulation PTA.   HL is therapeutic at 0.39 on 1050 units/hr  Goal of Therapy:  Heparin level 0.3-0.7 units/ml Monitor platelets by anticoagulation protocol: Yes   Plan:  Continue heparin to 1050 units/hr Daily HL, CBC  Darin Arndt A Sanav Remer 12/09/2016,8:01 AM

## 2016-12-09 NOTE — H&P (Signed)
Cardiology Admission History and Physical:   Patient ID: Monica Lynch; MRN: [id number]; DOB: 1960-04-16   Admission date: 12/08/2016  Primary Care Provider: Sharlene Dory, DO Primary Cardiologist: Gypsy Balsam, MD  Chief Complaint:  Chest pain, abnormal stress test  Patient Profile:   Monica Lynch is a 56 y.o. female with a history of HTN, asthma presents after an abnormal stress test obtained for intermittent chest pain.  History of Present Illness:   Ms. Mondor reports that for the past 4-6 months she has had intermittent episodes of chest discomfort at rest that last 2-3 minutes and occur every 2-3 months.  The episodes are associated with a pressure like sensation over her chest and are associated with dyspnea.  She told her PCP about this 1 month ago who referred her to cardiology.  Dr. Verne Carrow saw her on 9/21 and ordered a nuclear stress test and started her on metoprolol and aspirin.  She had her nuclear stress test today during which she states she had reproduction of her symptoms (chest pressure, out of breath) though less in severity than her symptoms she had been having at home.  Her ECG demonstrated ST depressions and nuclear scan showed inferior ischemia so she was advised to go to the ED for heparin gtt and LHC next week.  On my evaluation, she is comfortable, chest pain free and has no acute complaints.  She states that she has no personal history of heart disease and is a lifetime non-smoker.  States that her mother had CAD in her 55s and a brother with an MI in his 88s.  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  She denies fevers, chills, ns, nausea, vomiting, diarrhea, dysuria, new numbness or weakness.  Past Medical History:  Diagnosis Date  . Asthma   . Cataract    current early stage  . Essential hypertension 12/01/2016  . Osteoarthritis   . Seasonal allergies     Past Surgical History:    Procedure Laterality Date  . screws removed from ankles    . TUBAL LIGATION       Medications Prior to Admission: Prior to Admission medications   Medication Sig Start Date End Date Taking? Authorizing Provider  acetaminophen (TYLENOL) 500 MG tablet Take 500 mg by mouth every 6 (six) hours as needed for headache.   Yes [provider]  aspirin EC 81 MG tablet Take 1 tablet (81 mg total) by mouth daily. 12/01/16  Yes Georgeanna Lea, MD  metoprolol tartrate (LOPRESSOR) 25 MG tablet Take 1 tablet (25 mg total) by mouth 2 (two) times daily. 12/01/16 03/01/17 Yes Georgeanna Lea, MD  nitroGLYCERIN (NITROSTAT) 0.4 MG SL tablet Place 1 tablet (0.4 mg total) under the tongue every 5 (five) minutes as needed for chest pain. 12/01/16 03/01/17 Yes Georgeanna Lea, MD     Allergies:    Allergies  Allergen Reactions  . Tape Other (See Comments)    blister    Social History:   Social History   Social History  . Marital status: Married    Spouse name: N/A  . Number of children: N/A  . Years of education: N/A   Occupational History  . Not on file.   Social History Main Topics  . Smoking status: Never Smoker  . Smokeless tobacco: Never Used  . Alcohol use Yes     Comment: occasional  . Drug use: No  . Sexual activity:  Not on file   Other Topics Concern  . Not on file   Social History Narrative  . No narrative on file    Family History:   The patient's family history includes COPD in her father; Heart attack (age of onset: 29) in her brother; Heart disease in her mother; Hyperlipidemia in her father, maternal grandfather, and mother; Hypertension in her mother.    ROS:  Please see the history of present illness.  CV: +CP with exertion and at rest, + SOB GI: no n/v, c/d GU: no dysuria Neuro: no new numbness or weakness All other ROS reviewed and negative.     Physical Exam/Data:   Vitals:   12/08/16 2300 12/08/16 2330 12/09/16 0003 12/09/16 0009  BP:  (!) 149/75 133/84  (!) 153/73  Pulse: 65 60  60  Resp: Temp:    98.2 F (36.8 C)  TempSrc:    Oral  SpO2: 98% 97%  97%  Weight:   78.6 kg (173 lb 4.5 oz)   Height:    (1.575 m)    No intake or output data in the 24 hours ending 12/09/16 0118 Filed Weights   12/08/16 1315 12/09/16 0003  Weight: 79.4 kg (175 lb) 78.6 kg (173 lb 4.5 oz)   Body mass index is 31.69 kg/m.  General:  Well nourished, well developed, in no acute distress HEENT: normal Lymph: no adenopathy Neck: no JVD Endocrine:  No thryomegaly Vascular: No carotid bruits; FA pulses 2+ bilaterally without bruits  Cardiac:  normal S1, S2; RRR; no murmur  Lungs:  clear to auscultation bilaterally, no wheezing, rhonchi or rales  Abd: soft, nontender, no hepatomegaly  Ext: no edema Musculoskeletal:  No deformities, BUE and BLE strength normal and equal Skin: warm and dry  Neuro:  CNs 2-12 intact, no focal abnormalities noted Psych:  Normal affect    EKG:  The ECG that was done  was personally reviewed and demonstrates NSR with borderline Q waves in the inferior leads (  Relevant CV Studies: MPI 12/08/16:  The left ventricular ejection fraction is normal (55-65%).  Nuclear stress EF: 56%.  Blood pressure demonstrated a normal response to exercise.  Downsloping ST segment depression ST segment depression of 2 mm was noted during stress in the I, aVL, V6 and V5 leadsST deviation beginning in recovery.  Defect 1: There is a large defect of severe severity present in the mid inferior, mid inferolateral, apical inferior and apical lateral location.  Findings consistent with ischemia.  This is a high risk study.   High risk stress nuclear study with extensive inferior and inferolateral ischemia, almost entirely reversible. This is consistent with right coronary and/or left circumflex artery stenosis. The ECG response is concerning for multivessel CAD or left main coronary stenosis. Normal left  ventricular regional and global systolic function.  Laboratory Data:  Chemistry Recent Labs Lab 12/08/16 1230  NA 139  K 3.9  CL 108  CO2 23  GLUCOSE 85  BUN 10  CREATININE 0.61  CALCIUM 9.3  GFRNONAA >60  GFRAA >60  ANIONGAP 8     Recent Labs Lab 12/08/16 1230  PROT 7.3  ALBUMIN 4.2  AST 18  ALT 15  ALKPHOS 52  BILITOT 0.7   Hematology Recent Labs Lab 12/08/16 1230  WBC 8.0  RBC 4.65  HGB 14.3  HCT 42.1  MCV 90.5  MCH 30.8  MCHC 34.0  RDW 12.4  PLT 279   Cardiac Enzymes Recent  Labs Lab 12/08/16 1230  TROPONINI <0.03   No results for input(s): TROPIPOC in the last 168 hours.  BNPNo results for input(s): BNP, PROBNP in the last 168 hours.  DDimer No results for input(s): DDIMER in the last 168 hours.  Radiology/Studies:  Dg Chest Port 1 View  Result Date: 12/08/2016 CLINICAL DATA:  Intermittent chest pain. EXAM: PORTABLE CHEST 1 VIEW COMPARISON:  None. FINDINGS: Lungs are clear. Heart and mediastinum are within normal limits. The trachea is midline. No large pleural effusions. Negative for a pneumothorax. Bony thorax is intact. IMPRESSION: No active disease. Electronically Signed   By: Richarda Overlie M.D.   On: 12/08/2016 13:24    Assessment and Plan:   1. Acute coronary syndrome, Unstable angina: Pt reports months of staggering CP at rest that is of typical quality and duration of angina.  She has a FHx of CAD with her brother having an MI in his 48s.  She has an elevated LDL at 137.  Her symptom profile, ECG resopnse to exercise and myocardial perfusion scan are all concerning for coronary ischemia.  She is currently symptom free while sitting at rest.  Will plan for [facility] on Monday to evaluate anatomy and need for a stent. 1. LHC on Monday (or sooner if CP recurs and is not controllable with nitroglycerin) 2. Heparin gtt per pharmacy 3. Continue ASA, metoprolol, start atorvastatin  QHS 4. Refer to cardiac rehab at time of dc 5. Daily  ECG 2. HTN: 1. Continue home metoprolol 3. Osteoarthritis 1. APAP prn pain 4. Bundle 1. Cardiac diet (plan for cath Monday) 2. Heparin gtt (no need for PPX) 3. Full code  Severity of Illness: The appropriate patient status for this patient is INPATIENT. Inpatient status is judged to be reasonable and necessary in order to provide the required intensity of service to ensure the patient's safety. The patient's presenting symptoms, physical exam findings, and initial radiographic and laboratory data in the context of their chronic comorbidities is felt to place them at high risk for further clinical deterioration. Furthermore, it is not anticipated that the patient will be medically stable for discharge from the hospital within 2 midnights of admission. The following factors support the patient status of inpatient.   " The patient's presenting symptoms include CP at rest, DOE. " The worrisome physical exam findings include: none. " The initial radiographic and laboratory data are worrisome because of: positive stress test. " The chronic co-morbidities include HTN.   * I certify that at the point of admission it is my clinical judgment that the patient will require inpatient hospital care spanning beyond 2 midnights from the point of admission due to high intensity of service, high risk for further deterioration and high frequency of surveillance required.*    For questions or updates, please contact [facility] Please consult [email or web address] for contact info under Cardiology/STEMI.    Signed, Oliva Bustard, MD  12/09/2016 1:18 AM

## 2016-12-10 LAB — CBC
HCT: 40.4 % (ref 36.0–46.0)
Hemoglobin: 13.4 g/dL (ref 12.0–15.0)
MCH: 30.7 pg (ref 26.0–34.0)
MCHC: 33.2 g/dL (ref 30.0–36.0)
MCV: 92.4 fL (ref 78.0–100.0)
PLATELETS: 304 10*3/uL (ref 150–400)
RBC: 4.37 MIL/uL (ref 3.87–5.11)
RDW: 12.7 % (ref 11.5–15.5)
WBC: 6.2 10*3/uL (ref 4.0–10.5)

## 2016-12-10 LAB — HEPARIN LEVEL (UNFRACTIONATED): Heparin Unfractionated: 0.42 IU/mL (ref 0.30–0.70)

## 2016-12-10 MED ORDER — SODIUM CHLORIDE 0.9 % IV SOLN: 250.0000 mL | INTRAVENOUS | Status: DC | PRN

## 2016-12-10 MED ORDER — SODIUM CHLORIDE 0.9% FLUSH
3.0000 mL | Freq: Two times a day (BID) | INTRAVENOUS | Status: DC
  Administered 2016-12-10: 3 mL via INTRAVENOUS

## 2016-12-10 MED ORDER — ASPIRIN 81 MG PO CHEW
81.0000 mg | CHEWABLE_TABLET | ORAL | Status: AC
  Administered 2016-12-11: 81 mg via ORAL
  Filled 2016-12-10: qty 1

## 2016-12-10 MED ORDER — SODIUM CHLORIDE 0.9% FLUSH: 3.0000 mL | INTRAVENOUS | Status: DC | PRN

## 2016-12-10 MED ORDER — SODIUM CHLORIDE 0.9 % WEIGHT BASED INFUSION
3.0000 mL/kg/h | INTRAVENOUS | Status: DC
  Administered 2016-12-11: 3 mL/kg/h via INTRAVENOUS

## 2016-12-10 MED ORDER — SODIUM CHLORIDE 0.9 % WEIGHT BASED INFUSION
1.0000 mL/kg/h | INTRAVENOUS | Status: DC
  Administered 2016-12-11: 1 mL/kg/h via INTRAVENOUS

## 2016-12-10 NOTE — Progress Notes (Signed)
ANTICOAGULATION CONSULT NOTE  Pharmacy Consult for heparin Indication: chest pain/ACS  Allergies  Allergen Reactions  . Tape Other (See Comments)    blister    Patient Measurements: Height:  (157.5 cm) Weight: 171 lb 8.3 oz (77.8 kg) IBW/kg (Calculated) : 50.1 Heparin Dosing Weight: 67.7kg  Vital Signs: Temp: 97.9 F (36.6 C) (09/30 0413) Temp Source: Oral (09/30 0413) BP: 119/65 (09/30 0413) Pulse Rate: 60 (09/30 0413)  Labs:  Recent Labs  12/08/16 1230 12/08/16 2130 12/09/16 0537 12/10/16 0458  HGB 14.3  --  13.0 13.4  HCT 42.1  --  38.9 40.4  PLT 279  --  269 304  LABPROT 13.0  --   --   --   INR 0.99  --   --   --   HEPARINUNFRC  --  0.29* 0.39 0.42  CREATININE 0.61  --  0.59  --   TROPONINI <0.03  --   --   --     Estimated Creatinine Clearance: 77.7 mL/min (by C-G formula based on SCr of 0.59 mg/dL).   Medical History: Past Medical History:  Diagnosis Date  . Asthma   . Cataract    current early stage  . Essential hypertension 12/01/2016  . Osteoarthritis   . Seasonal allergies     Medications:  Infusions:  . heparin 1,050 Units/hr (12/09/16 0518)    Assessment: 56 yof presented to the ED after an abnormal stress test. Started on IV heparin for ACS. Baseline CBC is WNL and she is not on anticoagulation PTA.   HL is therapeutic at 0.42 on 1050 units/hr  Goal of Therapy:  Heparin level 0.3-0.7 units/ml Monitor platelets by anticoagulation protocol: Yes   Plan:  Continue heparin to 1050 units/hr Daily HL, CBC  Ibraheem Voris A Georgia Baria 12/10/2016,8:08 AM

## 2016-12-10 NOTE — Progress Notes (Signed)
Subjective:  No chest pain or shortness of breath.  Objective:  Vital Signs in the last 24 hours: BP 119/65 (BP Location: Right Arm)   Pulse 60   Temp 97.9 F (36.6 C) (Oral)   Resp 18   Ht  (1.575 m)   Wt 77.8 kg (171 lb 8.3 oz)   SpO2 100%   BMI 31.37 kg/m   Physical Exam: mildly obese female in no acute distress Lungs:  Clear Cardiac:  Regular rhythm, normal S1 and S2, no S3 Extremities:  No edema present  Intake/Output from previous day: 09/29 0701 - 09/30 0700 In: 480 [P.O.:480] Out: -   Weight Filed Weights   12/09/16 0003 12/09/16 0500 12/10/16 0413  Weight: 78.6 kg (173 lb 4.5 oz) 78.5 kg (173 lb) 77.8 kg (171 lb 8.3 oz)    Lab Results: Basic Metabolic Panel:  Recent Labs  16/10/96 1230 12/09/16 0537  NA 139 139  K 3.9 3.5  CL 108 106  CO2 23 24  GLUCOSE 85 95  BUN 10 8  CREATININE 0.61 0.59   CBC:  Recent Labs  12/08/16 1230 12/09/16 0537 12/10/16 0458  WBC 8.0 7.0 6.2  NEUTROABS 5.5  --   --   HGB 14.3 13.0 13.4  HCT 42.1 38.9 40.4  MCV 90.5 90.9 92.4  PLT 279 269 304   Cardiac Panel (last 3 results)  Recent Labs  12/08/16 1230  TROPONINI <0.03    Telemetry: Sinus rhythm personally reviewed  Assessment/Plan:  1.  Progressive angina with an abnormal stress test 2.  Hypertension-controlled  Recommendations:  Catheterization planned for tomorrow.  Procedure again discussed with patient.  Continue heparin.    Darden Palmer  MD [facility] Cardiology  12/10/2016, 9:01 AM

## 2016-12-11 ENCOUNTER — Inpatient Hospital Stay (HOSPITAL_COMMUNITY): Payer: Self-pay

## 2016-12-11 ENCOUNTER — Inpatient Hospital Stay (HOSPITAL_COMMUNITY)
Admission: EM | Disposition: A | Discharge status: Home / Self Care | Payer: Self-pay | Source: Home / Self Care | Attending: Cardiothoracic Surgery

## 2016-12-11 ENCOUNTER — Other Ambulatory Visit: Payer: Self-pay | Admitting: *Deleted

## 2016-12-11 ENCOUNTER — Encounter (HOSPITAL_COMMUNITY): Payer: Self-pay | Admitting: Cardiothoracic Surgery

## 2016-12-11 DIAGNOSIS — I25110 Atherosclerotic heart disease of native coronary artery with unstable angina pectoris: Principal | ICD-10-CM

## 2016-12-11 DIAGNOSIS — I340 Nonrheumatic mitral (valve) insufficiency: Secondary | ICD-10-CM

## 2016-12-11 DIAGNOSIS — I10 Essential (primary) hypertension: Secondary | ICD-10-CM

## 2016-12-11 DIAGNOSIS — R0600 Dyspnea, unspecified: Secondary | ICD-10-CM

## 2016-12-11 DIAGNOSIS — I2510 Atherosclerotic heart disease of native coronary artery without angina pectoris: Secondary | ICD-10-CM

## 2016-12-11 DIAGNOSIS — Z01810 Encounter for preprocedural cardiovascular examination: Secondary | ICD-10-CM

## 2016-12-11 HISTORY — PX: LEFT HEART CATH AND CORONARY ANGIOGRAPHY: CATH118249

## 2016-12-11 LAB — URINALYSIS, ROUTINE W REFLEX MICROSCOPIC
Bilirubin Urine: NEGATIVE
Glucose, UA: NEGATIVE mg/dL
Hgb urine dipstick: NEGATIVE
Ketones, ur: NEGATIVE mg/dL
Nitrite: NEGATIVE
Protein, ur: NEGATIVE mg/dL
Specific Gravity, Urine: 1.028 (ref 1.005–1.030)
pH: 5 (ref 5.0–8.0)

## 2016-12-11 LAB — SURGICAL PCR SCREEN
MRSA, PCR: NEGATIVE
Staphylococcus aureus: NEGATIVE

## 2016-12-11 LAB — ECHOCARDIOGRAM COMPLETE
Ao-asc: 28 cm
CHL CUP MV DEC (S): 236
EWDT: 236 ms
FS: 33 % (ref 28–44)
Height: 62 in
IV/PV OW: 1
LA ID, A-P, ES: 38 mm
LA diam end sys: 38 mm
LA diam index: 2.03 cm/m2
LA vol A4C: 29.4 ml
LA vol: 30 mL
LAVOLIN: 16 mL/m2
LVELAT: 12 cm/s
LVOT area: 3.14 cm2
LVOTD: 20 mm
MV VTI: 178 cm
MVPKEVEL: 1.3 m/s
PISA EROA: 0.08 cm2
PW: 12 mm — AB (ref 0.6–1.1)
RV LATERAL S' VELOCITY: 10.3 cm/s
RV TAPSE: 18.4 mm
TDI e' lateral: 12
TDI e' medial: 7.62
Weight: 2740.8 oz

## 2016-12-11 LAB — BLOOD GAS, ARTERIAL
Acid-base deficit: 0.5 mmol/L (ref 0.0–2.0)
Bicarbonate: 23.4 mmol/L (ref 20.0–28.0)
Drawn by: 213381
FIO2: 21
O2 Saturation: 95.4 %
Patient temperature: 98.6
pCO2 arterial: 36.9 mmHg (ref 32.0–48.0)
pH, Arterial: 7.418 (ref 7.350–7.450)
pO2, Arterial: 80 mmHg — ABNORMAL LOW (ref 83.0–108.0)

## 2016-12-11 LAB — PULMONARY FUNCTION TEST
DL/VA % pred: 97 %
DL/VA: 4.41 ml/min/mmHg/L
DLCO cor % pred: 87 %
DLCO cor: 18.91 ml/min/mmHg
DLCO unc % pred: 90 %
DLCO unc: 19.41 ml/min/mmHg
FEF 25-75 Post: 3.23 L/sec
FEF 25-75 Pre: 2.06 L/sec
FEF2575-%Change-Post: 57 %
FEF2575-%Pred-Post: 131 %
FEF2575-%Pred-Pre: 83 %
FEV1-%Change-Post: 15 %
FEV1-%Pred-Post: 97 %
FEV1-%Pred-Pre: 85 %
FEV1-Post: 2.46 L
FEV1-Pre: 2.14 L
FEV1FVC-%Change-Post: 8 %
FEV1FVC-%Pred-Pre: 100 %
FEV6-%Change-Post: 5 %
FEV6-%Pred-Post: 92 %
FEV6-%Pred-Pre: 87 %
FEV6-Post: 2.86 L
FEV6-Pre: 2.7 L
FEV6FVC-%Pred-Post: 103 %
FEV6FVC-%Pred-Pre: 103 %
FVC-%Change-Post: 5 %
FVC-%Pred-Post: 89 %
FVC-%Pred-Pre: 84 %
FVC-Post: 2.86 L
FVC-Pre: 2.7 L
Post FEV1/FVC ratio: 86 %
Post FEV6/FVC ratio: 100 %
Pre FEV1/FVC ratio: 79 %
Pre FEV6/FVC Ratio: 100 %
RV % pred: 118 %
RV: 2.11 L
TLC % pred: 102 %
TLC: 4.87 L

## 2016-12-11 LAB — CBC
HCT: 41.3 % (ref 36.0–46.0)
Hemoglobin: 13.8 g/dL (ref 12.0–15.0)
MCH: 30.3 pg (ref 26.0–34.0)
MCHC: 33.4 g/dL (ref 30.0–36.0)
MCV: 90.8 fL (ref 78.0–100.0)
PLATELETS: 278 10*3/uL (ref 150–400)
RBC: 4.55 MIL/uL (ref 3.87–5.11)
RDW: 12.4 % (ref 11.5–15.5)
WBC: 6.7 10*3/uL (ref 4.0–10.5)

## 2016-12-11 LAB — COMPREHENSIVE METABOLIC PANEL
ALT: 22 U/L (ref 14–54)
AST: 28 U/L (ref 15–41)
Albumin: 3.8 g/dL (ref 3.5–5.0)
Alkaline Phosphatase: 45 U/L (ref 38–126)
Anion gap: 9 (ref 5–15)
BUN: 12 mg/dL (ref 6–20)
CO2: 22 mmol/L (ref 22–32)
Calcium: 9 mg/dL (ref 8.9–10.3)
Chloride: 108 mmol/L (ref 101–111)
Creatinine, Ser: 0.65 mg/dL (ref 0.44–1.00)
GFR calc Af Amer: >60 mL/min (ref >60)
GFR calc non Af Amer: >60 mL/min (ref >60)
Glucose, Bld: 118 mg/dL — ABNORMAL HIGH (ref 65–99)
Potassium: 3.4 mmol/L — ABNORMAL LOW (ref 3.5–5.1)
Sodium: 139 mmol/L (ref 135–145)
Total Bilirubin: 0.6 mg/dL (ref 0.3–1.2)
Total Protein: 6.8 g/dL (ref 6.5–8.1)

## 2016-12-11 LAB — HEPARIN LEVEL (UNFRACTIONATED): HEPARIN UNFRACTIONATED: 0.44 [IU]/mL (ref 0.30–0.70)

## 2016-12-11 LAB — PROTIME-INR
INR: 1.02
Prothrombin Time: 13.3 seconds (ref 11.4–15.2)

## 2016-12-11 LAB — PREPARE RBC (CROSSMATCH)

## 2016-12-11 LAB — HEMOGLOBIN A1C
Hgb A1c MFr Bld: 5.1 % (ref 4.8–5.6)
Mean Plasma Glucose: 99.67 mg/dL

## 2016-12-11 SURGERY — LEFT HEART CATH AND CORONARY ANGIOGRAPHY
Anesthesia: LOCAL

## 2016-12-11 MED ORDER — FENTANYL CITRATE (PF) 100 MCG/2ML IJ SOLN
INTRAMUSCULAR | Status: DC | PRN
  Administered 2016-12-11: 50 ug via INTRAVENOUS

## 2016-12-11 MED ORDER — BISACODYL 5 MG PO TBEC
5.0000 mg | DELAYED_RELEASE_TABLET | Freq: Once | ORAL | Status: AC
  Administered 2016-12-11: 5 mg via ORAL
  Filled 2016-12-11: qty 1

## 2016-12-11 MED ORDER — HEPARIN (PORCINE) IN NACL 100-0.45 UNIT/ML-% IJ SOLN
1100.0000 [IU]/h | INTRAMUSCULAR | Status: DC
  Administered 2016-12-11: 1100 [IU]/h via INTRAVENOUS
  Filled 2016-12-11: qty 250

## 2016-12-11 MED ORDER — HEPARIN SODIUM (PORCINE) 1000 UNIT/ML IJ SOLN
INTRAMUSCULAR | Status: AC
  Filled 2016-12-11: qty 1

## 2016-12-11 MED ORDER — LIDOCAINE HCL 2 % IJ SOLN
INTRAMUSCULAR | Status: AC
  Filled 2016-12-11: qty 10

## 2016-12-11 MED ORDER — HEPARIN (PORCINE) IN NACL 2-0.9 UNIT/ML-% IJ SOLN
INTRAMUSCULAR | Status: AC
  Filled 2016-12-11: qty 1000

## 2016-12-11 MED ORDER — HEPARIN (PORCINE) IN NACL 2-0.9 UNIT/ML-% IJ SOLN
INTRAMUSCULAR | Status: AC | PRN
  Administered 2016-12-11: 1000 mL

## 2016-12-11 MED ORDER — NITROGLYCERIN IN D5W 200-5 MCG/ML-% IV SOLN
2.0000 ug/min | INTRAVENOUS | Status: AC
  Administered 2016-12-12: 10 ug/min via INTRAVENOUS
  Filled 2016-12-11: qty 250

## 2016-12-11 MED ORDER — DEXTROSE 5 % IV SOLN
0.0000 ug/min | INTRAVENOUS | Status: DC
  Filled 2016-12-11: qty 4

## 2016-12-11 MED ORDER — MIDAZOLAM HCL 2 MG/2ML IJ SOLN
INTRAMUSCULAR | Status: DC | PRN
  Administered 2016-12-11: 1 mg via INTRAVENOUS

## 2016-12-11 MED ORDER — CHLORHEXIDINE GLUCONATE 4 % EX LIQD
60.0000 mL | Freq: Once | CUTANEOUS | Status: AC
  Administered 2016-12-12: 4 via TOPICAL
  Filled 2016-12-11: qty 60

## 2016-12-11 MED ORDER — HEPARIN (PORCINE) IN NACL 100-0.45 UNIT/ML-% IJ SOLN: 1100.0000 [IU]/h | INTRAMUSCULAR | Status: DC

## 2016-12-11 MED ORDER — TEMAZEPAM 15 MG PO CAPS: 15.0000 mg | ORAL_CAPSULE | Freq: Once | ORAL | Status: DC | PRN

## 2016-12-11 MED ORDER — VERAPAMIL HCL 2.5 MG/ML IV SOLN
INTRAVENOUS | Status: DC | PRN
  Administered 2016-12-11: 10 mL via INTRA_ARTERIAL

## 2016-12-11 MED ORDER — TRANEXAMIC ACID 1000 MG/10ML IV SOLN
1.5000 mg/kg/h | INTRAVENOUS | Status: AC
  Administered 2016-12-12: 1.5 mg/kg/h via INTRAVENOUS
  Filled 2016-12-11: qty 25

## 2016-12-11 MED ORDER — ALPRAZOLAM 0.25 MG PO TABS: 0.2500 mg | ORAL_TABLET | ORAL | Status: DC | PRN

## 2016-12-11 MED ORDER — PHENYLEPHRINE HCL 10 MG/ML IJ SOLN
30.0000 ug/min | INTRAMUSCULAR | Status: AC
  Administered 2016-12-12: 25 ug/min via INTRAVENOUS
  Filled 2016-12-11: qty 2

## 2016-12-11 MED ORDER — VERAPAMIL HCL 2.5 MG/ML IV SOLN
INTRAVENOUS | Status: AC
  Filled 2016-12-11: qty 2

## 2016-12-11 MED ORDER — VANCOMYCIN HCL 10 G IV SOLR
1250.0000 mg | INTRAVENOUS | Status: AC
  Administered 2016-12-12: 1250 mg via INTRAVENOUS
  Filled 2016-12-11: qty 1250

## 2016-12-11 MED ORDER — DEXMEDETOMIDINE HCL IN NACL 400 MCG/100ML IV SOLN
0.1000 ug/kg/h | INTRAVENOUS | Status: AC
Start: 2016-12-12 — End: 2016-12-12
  Administered 2016-12-12: .7 ug/kg/h via INTRAVENOUS
  Filled 2016-12-11: qty 100

## 2016-12-11 MED ORDER — TRANEXAMIC ACID (OHS) BOLUS VIA INFUSION
15.0000 mg/kg | INTRAVENOUS | Status: AC
  Administered 2016-12-12: 1165.5 mg via INTRAVENOUS
  Filled 2016-12-11: qty 1166

## 2016-12-11 MED ORDER — HEPARIN SODIUM (PORCINE) 1000 UNIT/ML IJ SOLN
INTRAMUSCULAR | Status: DC | PRN
  Administered 2016-12-11: 4000 [IU] via INTRAVENOUS

## 2016-12-11 MED ORDER — IOPAMIDOL (ISOVUE-370) INJECTION 76%
INTRAVENOUS | Status: DC | PRN
  Administered 2016-12-11: 45 mL via INTRA_ARTERIAL

## 2016-12-11 MED ORDER — CIPROFLOXACIN IN D5W 400 MG/200ML IV SOLN
400.0000 mg | Freq: Two times a day (BID) | INTRAVENOUS | Status: DC
  Administered 2016-12-11: 400 mg via INTRAVENOUS
  Filled 2016-12-11 (×3): qty 200

## 2016-12-11 MED ORDER — CHLORHEXIDINE GLUCONATE 0.12 % MT SOLN
15.0000 mL | Freq: Once | OROMUCOSAL | Status: AC
  Administered 2016-12-12: 15 mL via OROMUCOSAL
  Filled 2016-12-11: qty 15

## 2016-12-11 MED ORDER — IOPAMIDOL (ISOVUE-370) INJECTION 76%
INTRAVENOUS | Status: AC
  Filled 2016-12-11: qty 100

## 2016-12-11 MED ORDER — METOPROLOL TARTRATE 12.5 MG HALF TABLET: 12.5000 mg | ORAL_TABLET | Freq: Once | ORAL | Status: DC

## 2016-12-11 MED ORDER — SODIUM CHLORIDE 0.9 % IV SOLN
250.0000 mL | INTRAVENOUS | Status: DC | PRN
  Administered 2016-12-12: 08:00:00 via INTRAVENOUS

## 2016-12-11 MED ORDER — DEXTROSE 5 % IV SOLN
1.5000 g | INTRAVENOUS | Status: AC
  Administered 2016-12-12: .75 g via INTRAVENOUS
  Administered 2016-12-12: 1.5 g via INTRAVENOUS
  Filled 2016-12-11: qty 1.5

## 2016-12-11 MED ORDER — DIAZEPAM 2 MG PO TABS
2.0000 mg | ORAL_TABLET | Freq: Once | ORAL | Status: AC
  Administered 2016-12-12: 2 mg via ORAL
  Filled 2016-12-11: qty 1

## 2016-12-11 MED ORDER — SODIUM CHLORIDE 0.9% FLUSH: 3.0000 mL | INTRAVENOUS | Status: DC | PRN

## 2016-12-11 MED ORDER — POTASSIUM CHLORIDE 2 MEQ/ML IV SOLN
80.0000 meq | INTRAVENOUS | Status: DC
  Filled 2016-12-11: qty 40

## 2016-12-11 MED ORDER — HEPARIN SODIUM (PORCINE) 1000 UNIT/ML IJ SOLN
INTRAMUSCULAR | Status: DC
  Filled 2016-12-11: qty 30

## 2016-12-11 MED ORDER — SODIUM CHLORIDE 0.9 % IV SOLN
INTRAVENOUS | Status: AC
  Administered 2016-12-12: .8 [IU]/h via INTRAVENOUS
  Filled 2016-12-11: qty 1

## 2016-12-11 MED ORDER — CHLORHEXIDINE GLUCONATE 4 % EX LIQD
60.0000 mL | Freq: Once | CUTANEOUS | Status: AC
  Administered 2016-12-11: 4 via TOPICAL
  Filled 2016-12-11: qty 60

## 2016-12-11 MED ORDER — ALBUTEROL SULFATE (2.5 MG/3ML) 0.083% IN NEBU
2.5000 mg | INHALATION_SOLUTION | Freq: Once | RESPIRATORY_TRACT | Status: AC
  Administered 2016-12-11: 2.5 mg via RESPIRATORY_TRACT

## 2016-12-11 MED ORDER — MAGNESIUM SULFATE 50 % IJ SOLN
40.0000 meq | INTRAMUSCULAR | Status: DC
  Filled 2016-12-11: qty 10

## 2016-12-11 MED ORDER — PLASMA-LYTE 148 IV SOLN
INTRAVENOUS | Status: AC
  Administered 2016-12-12: 500 mL
  Filled 2016-12-11: qty 2.5

## 2016-12-11 MED ORDER — FENTANYL CITRATE (PF) 100 MCG/2ML IJ SOLN
INTRAMUSCULAR | Status: AC
  Filled 2016-12-11: qty 2

## 2016-12-11 MED ORDER — LIDOCAINE HCL (PF) 1 % IJ SOLN
INTRAMUSCULAR | Status: DC | PRN
  Administered 2016-12-11: 1 mL

## 2016-12-11 MED ORDER — DEXTROSE 5 % IV SOLN
750.0000 mg | INTRAVENOUS | Status: DC
  Filled 2016-12-11: qty 750

## 2016-12-11 MED ORDER — SODIUM CHLORIDE 0.9% FLUSH
3.0000 mL | Freq: Two times a day (BID) | INTRAVENOUS | Status: DC
  Administered 2016-12-11: 3 mL via INTRAVENOUS

## 2016-12-11 MED ORDER — MIDAZOLAM HCL 2 MG/2ML IJ SOLN
INTRAMUSCULAR | Status: AC
  Filled 2016-12-11: qty 2

## 2016-12-11 MED ORDER — DOPAMINE-DEXTROSE 3.2-5 MG/ML-% IV SOLN
0.0000 ug/kg/min | INTRAVENOUS | Status: AC
  Administered 2016-12-12: 2 ug/kg/min via INTRAVENOUS
  Filled 2016-12-11: qty 250

## 2016-12-11 MED ORDER — TRANEXAMIC ACID (OHS) PUMP PRIME SOLUTION
2.0000 mg/kg | INTRAVENOUS | Status: DC
  Filled 2016-12-11: qty 1.55

## 2016-12-11 MED ORDER — SODIUM CHLORIDE 0.9 % IV SOLN: INTRAVENOUS | Status: AC

## 2016-12-11 SURGICAL SUPPLY — 11 items
CATH 5FR JL3.5 JR4 ANG PIG MP (CATHETERS) ×2 IMPLANT
COVER PRB 48X5XTLSCP FOLD TPE (BAG) ×1 IMPLANT
COVER PROBE 5X48 (BAG) ×1
DEVICE RAD COMP TR BAND LRG (VASCULAR PRODUCTS) ×2 IMPLANT
GLIDESHEATH SLEND SS 6F .021 (SHEATH) ×2 IMPLANT
GUIDEWIRE INQWIRE 1.5J.035X260 (WIRE) ×1 IMPLANT
INQWIRE 1.5J .035X260CM (WIRE) ×2
KIT HEART LEFT (KITS) ×2 IMPLANT
PACK CARDIAC CATHETERIZATION (CUSTOM PROCEDURE TRAY) ×2 IMPLANT
TRANSDUCER W/STOPCOCK (MISCELLANEOUS) ×2 IMPLANT
TUBING CIL FLEX 10 FLL-RA (TUBING) ×2 IMPLANT

## 2016-12-11 NOTE — H&P (View-Only) (Signed)
Progress Note  Patient Name: Monica Lynch Date of Encounter: 12/11/2016  Primary Cardiologist: Bing Matter  Subjective   No chest pain   Inpatient Medications    Scheduled Meds: . aspirin EC  81 mg Oral Daily  . atorvastatin  80 mg Oral q1800  . Influenza vac split quadrivalent PF  0.5 mL Intramuscular Tomorrow-1000  . metoprolol tartrate  25 mg Oral BID  . sodium chloride flush  3 mL Intravenous Q12H   Continuous Infusions: . sodium chloride    . sodium chloride 1 mL/kg/hr (12/11/16 0730)  . heparin 1,050 Units/hr (12/09/16 0518)   PRN Meds: sodium chloride, acetaminophen, nitroGLYCERIN, ondansetron (ZOFRAN) IV, sodium chloride flush   Vital Signs    Vitals:   12/10/16 0413 12/10/16 1351 12/10/16 2133 12/11/16 0532  BP: 119/65 110/62 128/66 (!) 111/58  Pulse: 60 63 61 (!) 51  Resp: 18   18  Temp: 97.9 F (36.6 C) 98 F (36.7 C)  98.3 F (36.8 C)  TempSrc: Oral Oral  Oral  SpO2: 100% 99%  99%  Weight: 171 lb 8.3 oz (77.8 kg)   171 lb 4.8 oz (77.7 kg)  Height:        Intake/Output Summary (Last 24 hours) at 12/11/16 0908 Last data filed at 12/11/16 4132  Gross per 24 hour  Intake              369 ml  Output                0 ml  Net              369 ml   Filed Weights   12/09/16 0500 12/10/16 0413 12/11/16 0532  Weight: 173 lb (78.5 kg) 171 lb 8.3 oz (77.8 kg) 171 lb 4.8 oz (77.7 kg)    Telemetry    NSR no arrhythmia  - Personally Reviewed  ECG    NSR  Normal ECG - Personally Reviewed  Physical Exam  No acute distress GEN: No acute distress.   Neck: No JVD Cardiac: RRR, no murmurs, rubs, or gallops.  Respiratory: Clear to auscultation bilaterally. GI: Soft, nontender, non-distended  MS: No edema; No deformity. Neuro:  Nonfocal  Psych: Normal affect   Labs    Chemistry Recent Labs Lab 12/08/16 1230 12/09/16 0537  NA 139 139  K 3.9 3.5  CL 108 106  CO2 23 24  GLUCOSE 85 95  BUN 10 8  CREATININE 0.61 0.59  CALCIUM 9.3 8.8*    PROT 7.3  --   ALBUMIN 4.2  --   AST 18  --   ALT 15  --   ALKPHOS 52  --   BILITOT 0.7  --   GFRNONAA >60 >60  GFRAA >60 >60  ANIONGAP 8 9     Hematology Recent Labs Lab 12/09/16 0537 12/10/16 0458 12/11/16 0401  WBC 7.0 6.2 6.7  RBC 4.28 4.37 4.55  HGB 13.0 13.4 13.8  HCT 38.9 40.4 41.3  MCV 90.9 92.4 90.8  MCH 30.4 30.7 30.3  MCHC 33.4 33.2 33.4  RDW 12.5 12.7 12.4  PLT 269 304 278    Cardiac Enzymes Recent Labs Lab 12/08/16 1230  TROPONINI <0.03   No results for input(s): TROPIPOC in the last 168 hours.   BNPNo results for input(s): BNP, PROBNP in the last 168 hours.   DDimer No results for input(s): DDIMER in the last 168 hours.   Radiology    No results found.  Cardiac Studies  Myovue 9/28 marked inferior wall ischemia  Patient Profile     56 y.o. female HTN and new onset chest pain with markedly positive myovue inferior wall ischemia for cath 12/11/16  Assessment & Plan    1) HTN;  Well controlled.  Continue current medications and low sodium Dash type diet.   2) Cholesterol on statin 3) chest pain for cath latter today labs ok on heparin   For questions or updates, please contact [facility] Please consult [email or web address] for contact info under Cardiology/STEMI.      Signed, Charlton Haws, MD  12/11/2016, 9:08 AM

## 2016-12-11 NOTE — Progress Notes (Signed)
Pre-op Cardiac Surgery  Carotid Findings:  No significant stenosis noted in bilateral carotid arteries.   Upper Extremity Right Left  Brachial Pressures NA - tband/triphasic Triphasic/140  Radial Waveforms Triphasic Triphasic  Ulnar Waveforms Triphasic Triphasic  Palmar Arch (Allen's Test) Not obtainable due to post cath - tband WNL   Findings:      Lower  Extremity Right Left  Dorsalis Pedis Triphasic/162 Triphasic/153  Posterior Tibial Triphasic/152 Triphasic/156  Ankle/Brachial Indices 1.1 1.1    Findings:  ABI within normal limits at rest.  Marilynne Halsted, BS, RDMS, RVT

## 2016-12-11 NOTE — Progress Notes (Signed)
  Echocardiogram 2D Echocardiogram has been performed.  Monica Lynch 12/11/2016, 12:05 PM
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
## 2016-12-11 NOTE — Progress Notes (Addendum)
ANTICOAGULATION CONSULT NOTE  Pharmacy Consult for heparin Indication: chest pain/ACS  Allergies  Allergen Reactions  . Tape Other (See Comments)    blister    Patient Measurements: Height:  (157.5 cm) Weight: 171 lb 4.8 oz (77.7 kg) IBW/kg (Calculated) : 50.1 Heparin Dosing Weight: 67.7kg  Vital Signs: Temp: 98.3 F (36.8 C) (10/01 0532) Temp Source: Oral (10/01 0532) BP: 111/58 (10/01 0532) Pulse Rate: 51 (10/01 0532)  Labs:  Recent Labs  12/08/16 1230  12/09/16 0537 12/10/16 0458 12/11/16 0401  HGB 14.3  --  13.0 13.4 13.8  HCT 42.1  --  38.9 40.4 41.3  PLT 279  --  269 304 278  LABPROT 13.0  --   --   --   --   INR 0.99  --   --   --   --   HEPARINUNFRC  --   < > 0.39 0.42 0.44  CREATININE 0.61  --  0.59  --   --   TROPONINI <0.03  --   --   --   --   < > = values in this interval not displayed.  Estimated Creatinine Clearance: 77.5 mL/min (by C-G formula based on SCr of 0.59 mg/dL).   Medical History: Past Medical History:  Diagnosis Date  . Asthma   . Cataract    current early stage  . Essential hypertension 12/01/2016  . Osteoarthritis   . Seasonal allergies     Medications:  Infusions:  . sodium chloride    . sodium chloride 1 mL/kg/hr (12/11/16 0730)  . heparin 1,050 Units/hr (12/09/16 0518)    Assessment: 56 yof presented to the ED after an abnormal stress test. Started on IV heparin for ACS. Baseline CBC is WNL and she is not on anticoagulation PTA.   HL is therapeutic   Goal of Therapy:  Heparin level 0.3-0.7 units/ml Monitor platelets by anticoagulation protocol: Yes   Plan:  Continue heparin to 1050 units/hr Follow up after cath  Thank you Okey Regal, PharmD [phone]  12/11/2016,8:29 AM    10:30 am: s/p cath with plans to resume heparin 2 hours post TR band deflation Resume heparin at 1100 units / hr with AM labs  Thank you Okey Regal, PharmD [phone]

## 2016-12-11 NOTE — Interval H&P Note (Signed)
History and Physical Interval Note:  12/11/2016 9:29 AM  Monica Lynch  has presented today for cardiac catheterization, with the diagnosis of unstable angina. The various methods of treatment have been discussed with the patient and family. After consideration of risks, benefits and other options for treatment, the patient has consented to  Procedure(s): LEFT HEART CATH AND CORONARY ANGIOGRAPHY (N/A) as a surgical intervention .  The patient's history has been reviewed, patient examined, no change in status, stable for surgery.  I have reviewed the patient's chart and labs.  Questions were answered to the patient's satisfaction.    Cath Lab Visit (complete for each Cath Lab visit)  Clinical Evaluation Leading to the Procedure:   ACS: Yes.    Non-ACS:    Anginal Classification: CCS IV  Anti-ischemic medical therapy: Minimal Therapy (1 class of medications)  Non-Invasive Test Results: High-risk stress test findings: cardiac mortality >3%/year  Prior CABG: No previous CABG  Dorina Ribaudo

## 2016-12-11 NOTE — Progress Notes (Signed)
 Progress Note  Patient Name: Monica Lynch Date of Encounter: 12/11/2016  Primary Cardiologist: Krasowski  Subjective   No chest pain   Inpatient Medications    Scheduled Meds: . aspirin EC  81 mg Oral Daily  . atorvastatin  80 mg Oral q1800  . Influenza vac split quadrivalent PF  0.5 mL Intramuscular Tomorrow-1000  . metoprolol tartrate  25 mg Oral BID  . sodium chloride flush  3 mL Intravenous Q12H   Continuous Infusions: . sodium chloride    . sodium chloride 1 mL/kg/hr (12/11/16 0730)  . heparin 1,050 Units/hr (12/09/16 0518)   PRN Meds: sodium chloride, acetaminophen, nitroGLYCERIN, ondansetron (ZOFRAN) IV, sodium chloride flush   Vital Signs    Vitals:   12/10/16 0413 12/10/16 1351 12/10/16 2133 12/11/16 0532  BP: 119/65 110/62 128/66 (!) 111/58  Pulse: 60 63 61 (!) 51  Resp: 18   18  Temp: 97.9 F (36.6 C) 98 F (36.7 C)  98.3 F (36.8 C)  TempSrc: Oral Oral  Oral  SpO2: 100% 99%  99%  Weight: 171 lb 8.3 oz (77.8 kg)   171 lb 4.8 oz (77.7 kg)  Height:        Intake/Output Summary (Last 24 hours) at 12/11/16 0908 Last data filed at 12/11/16 0614  Gross per 24 hour  Intake              369 ml  Output                0 ml  Net              369 ml   Filed Weights   12/09/16 0500 12/10/16 0413 12/11/16 0532  Weight: 173 lb (78.5 kg) 171 lb 8.3 oz (77.8 kg) 171 lb 4.8 oz (77.7 kg)    Telemetry    NSR no arrhythmia  - Personally Reviewed  ECG    NSR  Normal ECG - Personally Reviewed  Physical Exam  No acute distress GEN: No acute distress.   Neck: No JVD Cardiac: RRR, no murmurs, rubs, or gallops.  Respiratory: Clear to auscultation bilaterally. GI: Soft, nontender, non-distended  MS: No edema; No deformity. Neuro:  Nonfocal  Psych: Normal affect   Labs    Chemistry Recent Labs Lab 12/08/16 1230 12/09/16 0537  NA 139 139  K 3.9 3.5  CL 108 106  CO2 23 24  GLUCOSE 85 95  BUN 10 8  CREATININE 0.61 0.59  CALCIUM 9.3 8.8*    PROT 7.3  --   ALBUMIN 4.2  --   AST 18  --   ALT 15  --   ALKPHOS 52  --   BILITOT 0.7  --   GFRNONAA >60 >60  GFRAA >60 >60  ANIONGAP 8 9     Hematology Recent Labs Lab 12/09/16 0537 12/10/16 0458 12/11/16 0401  WBC 7.0 6.2 6.7  RBC 4.28 4.37 4.55  HGB 13.0 13.4 13.8  HCT 38.9 40.4 41.3  MCV 90.9 92.4 90.8  MCH 30.4 30.7 30.3  MCHC 33.4 33.2 33.4  RDW 12.5 12.7 12.4  PLT 269 304 278    Cardiac Enzymes Recent Labs Lab 12/08/16 1230  TROPONINI <0.03   No results for input(s): TROPIPOC in the last 168 hours.   BNPNo results for input(s): BNP, PROBNP in the last 168 hours.   DDimer No results for input(s): DDIMER in the last 168 hours.   Radiology    No results found.  Cardiac Studies     Myovue 9/28 marked inferior wall ischemia  Patient Profile     56 y.o. female HTN and new onset chest pain with markedly positive myovue inferior wall ischemia for cath 12/11/16  Assessment & Plan    1) HTN;  Well controlled.  Continue current medications and low sodium Dash type diet.   2) Cholesterol on statin 3) chest pain for cath latter today labs ok on heparin   For questions or updates, please contact [facility] Please consult [email or web address] for contact info under Cardiology/STEMI.      Signed, Charlton Haws, MD  12/11/2016, 9:08 AM

## 2016-12-11 NOTE — Progress Notes (Signed)
CARDIAC REHAB PHASE I   Discussed sternal precautions, IS, mobility, and d/c planning with pt. Voiced understanding. Gave her OHS booklet and careguide and video to watch. She is having a busy day with tests but is mobile in the room.  [phone]  Monica Lynch CES, ACSM 12/11/2016 2:06 PM

## 2016-12-12 ENCOUNTER — Inpatient Hospital Stay (HOSPITAL_COMMUNITY): Payer: Self-pay

## 2016-12-12 ENCOUNTER — Inpatient Hospital Stay (HOSPITAL_COMMUNITY)
Admission: EM | Disposition: A | Discharge status: Home / Self Care | Payer: Self-pay | Source: Home / Self Care | Attending: Cardiothoracic Surgery

## 2016-12-12 ENCOUNTER — Other Ambulatory Visit: Payer: Self-pay

## 2016-12-12 ENCOUNTER — Inpatient Hospital Stay (HOSPITAL_COMMUNITY): Payer: Self-pay | Admitting: Certified Registered Nurse Anesthetist

## 2016-12-12 DIAGNOSIS — I2510 Atherosclerotic heart disease of native coronary artery without angina pectoris: Secondary | ICD-10-CM | POA: Diagnosis present

## 2016-12-12 HISTORY — PX: TEE WITHOUT CARDIOVERSION: SHX5443

## 2016-12-12 HISTORY — PX: CORONARY ARTERY BYPASS GRAFT: SHX141

## 2016-12-12 LAB — POCT I-STAT 3, ART BLOOD GAS (G3+)
ACID-BASE DEFICIT: 3 mmol/L — AB (ref 0.0–2.0)
ACID-BASE DEFICIT: 5 mmol/L — AB (ref 0.0–2.0)
Acid-Base Excess: 4 mmol/L — ABNORMAL HIGH (ref 0.0–2.0)
Acid-base deficit: 2 mmol/L (ref 0.0–2.0)
Acid-base deficit: 2 mmol/L (ref 0.0–2.0)
Bicarbonate: 27.9 mmol/L (ref 20.0–28.0)
Bicarbonate: 21.4 mmol/L (ref 20.0–28.0)
Bicarbonate: 22.2 mmol/L (ref 20.0–28.0)
Bicarbonate: 20.3 mmol/L (ref 20.0–28.0)
Bicarbonate: 23.7 mmol/L (ref 20.0–28.0)
O2 SAT: 99 %
O2 Saturation: 100 %
O2 Saturation: 99 %
O2 Saturation: 100 %
O2 Saturation: 99 %
PCO2 ART: 38.1 mmHg (ref 32.0–48.0)
PCO2 ART: 33.8 mmHg (ref 32.0–48.0)
PCO2 ART: 32.5 mmHg (ref 32.0–48.0)
PCO2 ART: 44.6 mmHg (ref 32.0–48.0)
PH ART: 7.409 (ref 7.350–7.450)
PH ART: 7.439 (ref 7.350–7.450)
PH ART: 7.352 (ref 7.350–7.450)
PH ART: 7.333 — AB (ref 7.350–7.450)
PO2 ART: 405 mmHg — AB (ref 83.0–108.0)
PO2 ART: 163 mmHg — AB (ref 83.0–108.0)
Patient temperature: 35.9
Patient temperature: 37.1
TCO2: 29 mmol/L (ref 22–32)
TCO2: 22 mmol/L (ref 22–32)
TCO2: 23 mmol/L (ref 22–32)
TCO2: 21 mmol/L — ABNORMAL LOW (ref 22–32)
TCO2: 25 mmol/L (ref 22–32)
pCO2 arterial: 36.6 mmHg (ref 32.0–48.0)
pH, Arterial: 7.472 — ABNORMAL HIGH (ref 7.350–7.450)
pO2, Arterial: 114 mmHg — ABNORMAL HIGH (ref 83.0–108.0)
pO2, Arterial: 151 mmHg — ABNORMAL HIGH (ref 83.0–108.0)
pO2, Arterial: 165 mmHg — ABNORMAL HIGH (ref 83.0–108.0)

## 2016-12-12 LAB — VAS US DOPPLER PRE CABG
LEFT ECA DIAS: -30 cm/s
LEFT VERTEBRAL DIAS: -21 cm/s
Left CCA dist dias: 26 cm/s
Left CCA dist sys: 87 cm/s
Left CCA prox dias: 27 cm/s
Left CCA prox sys: 104 cm/s
Left ICA dist dias: -30 cm/s
Left ICA dist sys: -78 cm/s
Left ICA prox dias: -22 cm/s
Left ICA prox sys: -73 cm/s
RIGHT ECA DIAS: -18 cm/s
RIGHT VERTEBRAL DIAS: 15 cm/s
Right CCA prox dias: 22 cm/s
Right CCA prox sys: 93 cm/s
Right cca dist sys: -97 cm/s

## 2016-12-12 LAB — POCT I-STAT, CHEM 8
BUN: 10 mg/dL (ref 6–20)
BUN: 8 mg/dL (ref 6–20)
BUN: 9 mg/dL (ref 6–20)
BUN: 9 mg/dL (ref 6–20)
BUN: 8 mg/dL (ref 6–20)
BUN: 7 mg/dL (ref 6–20)
BUN: 7 mg/dL (ref 6–20)
CALCIUM ION: 1.22 mmol/L (ref 1.15–1.40)
CALCIUM ION: 1.15 mmol/L (ref 1.15–1.40)
CALCIUM ION: 1.12 mmol/L — AB (ref 1.15–1.40)
CHLORIDE: 100 mmol/L — AB (ref 101–111)
CHLORIDE: 104 mmol/L (ref 101–111)
CHLORIDE: 105 mmol/L (ref 101–111)
CREATININE: 0.3 mg/dL — AB (ref 0.44–1.00)
CREATININE: 0.5 mg/dL (ref 0.44–1.00)
Calcium, Ion: 0.95 mmol/L — ABNORMAL LOW (ref 1.15–1.40)
Calcium, Ion: 1.03 mmol/L — ABNORMAL LOW (ref 1.15–1.40)
Calcium, Ion: 1.08 mmol/L — ABNORMAL LOW (ref 1.15–1.40)
Calcium, Ion: 1.05 mmol/L — ABNORMAL LOW (ref 1.15–1.40)
Chloride: 106 mmol/L (ref 101–111)
Chloride: 107 mmol/L (ref 101–111)
Chloride: 100 mmol/L — ABNORMAL LOW (ref 101–111)
Chloride: 106 mmol/L (ref 101–111)
Creatinine, Ser: 0.4 mg/dL — ABNORMAL LOW (ref 0.44–1.00)
Creatinine, Ser: 0.3 mg/dL — ABNORMAL LOW (ref 0.44–1.00)
Creatinine, Ser: 0.5 mg/dL (ref 0.44–1.00)
Creatinine, Ser: 0.4 mg/dL — ABNORMAL LOW (ref 0.44–1.00)
Creatinine, Ser: 0.4 mg/dL — ABNORMAL LOW (ref 0.44–1.00)
GLUCOSE: 147 mg/dL — AB (ref 65–99)
Glucose, Bld: 99 mg/dL (ref 65–99)
Glucose, Bld: 113 mg/dL — ABNORMAL HIGH (ref 65–99)
Glucose, Bld: 116 mg/dL — ABNORMAL HIGH (ref 65–99)
Glucose, Bld: 159 mg/dL — ABNORMAL HIGH (ref 65–99)
Glucose, Bld: 131 mg/dL — ABNORMAL HIGH (ref 65–99)
Glucose, Bld: 144 mg/dL — ABNORMAL HIGH (ref 65–99)
HCT: 33 % — ABNORMAL LOW (ref 36.0–46.0)
HEMATOCRIT: 27 % — AB (ref 36.0–46.0)
HEMATOCRIT: 33 % — AB (ref 36.0–46.0)
HEMATOCRIT: 27 % — AB (ref 36.0–46.0)
HEMATOCRIT: 27 % — AB (ref 36.0–46.0)
HEMATOCRIT: 29 % — AB (ref 36.0–46.0)
HEMATOCRIT: 30 % — AB (ref 36.0–46.0)
HEMOGLOBIN: 11.2 g/dL — AB (ref 12.0–15.0)
HEMOGLOBIN: 11.2 g/dL — AB (ref 12.0–15.0)
HEMOGLOBIN: 9.2 g/dL — AB (ref 12.0–15.0)
HEMOGLOBIN: 9.2 g/dL — AB (ref 12.0–15.0)
HEMOGLOBIN: 9.9 g/dL — AB (ref 12.0–15.0)
Hemoglobin: 9.2 g/dL — ABNORMAL LOW (ref 12.0–15.0)
Hemoglobin: 10.2 g/dL — ABNORMAL LOW (ref 12.0–15.0)
POTASSIUM: 4.1 mmol/L (ref 3.5–5.1)
POTASSIUM: 4.2 mmol/L (ref 3.5–5.1)
POTASSIUM: 4.6 mmol/L (ref 3.5–5.1)
Potassium: 3.9 mmol/L (ref 3.5–5.1)
Potassium: 3.7 mmol/L (ref 3.5–5.1)
Potassium: 3.7 mmol/L (ref 3.5–5.1)
Potassium: 3.6 mmol/L (ref 3.5–5.1)
SODIUM: 141 mmol/L (ref 135–145)
SODIUM: 141 mmol/L (ref 135–145)
SODIUM: 143 mmol/L (ref 135–145)
SODIUM: 137 mmol/L (ref 135–145)
SODIUM: 141 mmol/L (ref 135–145)
SODIUM: 139 mmol/L (ref 135–145)
Sodium: 139 mmol/L (ref 135–145)
TCO2: 26 mmol/L (ref 22–32)
TCO2: 24 mmol/L (ref 22–32)
TCO2: 26 mmol/L (ref 22–32)
TCO2: 25 mmol/L (ref 22–32)
TCO2: 25 mmol/L (ref 22–32)
TCO2: 21 mmol/L — AB (ref 22–32)
TCO2: 22 mmol/L (ref 22–32)

## 2016-12-12 LAB — CBC
HCT: 34.5 % — ABNORMAL LOW (ref 36.0–46.0)
HCT: 32 % — ABNORMAL LOW (ref 36.0–46.0)
HEMATOCRIT: 40.5 % (ref 36.0–46.0)
HEMOGLOBIN: 13.8 g/dL (ref 12.0–15.0)
HEMOGLOBIN: 11.5 g/dL — AB (ref 12.0–15.0)
Hemoglobin: 10.6 g/dL — ABNORMAL LOW (ref 12.0–15.0)
MCH: 31.2 pg (ref 26.0–34.0)
MCH: 30 pg (ref 26.0–34.0)
MCH: 29.9 pg (ref 26.0–34.0)
MCHC: 34.1 g/dL (ref 30.0–36.0)
MCHC: 33.3 g/dL (ref 30.0–36.0)
MCHC: 33.1 g/dL (ref 30.0–36.0)
MCV: 91.4 fL (ref 78.0–100.0)
MCV: 90.1 fL (ref 78.0–100.0)
MCV: 90.4 fL (ref 78.0–100.0)
Platelets: 274 10*3/uL (ref 150–400)
Platelets: 182 10*3/uL (ref 150–400)
Platelets: 188 10*3/uL (ref 150–400)
RBC: 4.43 MIL/uL (ref 3.87–5.11)
RBC: 3.83 MIL/uL — AB (ref 3.87–5.11)
RBC: 3.54 MIL/uL — ABNORMAL LOW (ref 3.87–5.11)
RDW: 12.5 % (ref 11.5–15.5)
RDW: 12.3 % (ref 11.5–15.5)
RDW: 12.2 % (ref 11.5–15.5)
WBC: 6.9 10*3/uL (ref 4.0–10.5)
WBC: 17.9 10*3/uL — AB (ref 4.0–10.5)
WBC: 15.9 10*3/uL — ABNORMAL HIGH (ref 4.0–10.5)

## 2016-12-12 LAB — BASIC METABOLIC PANEL
ANION GAP: 9 (ref 5–15)
BUN: 12 mg/dL (ref 6–20)
CALCIUM: 9.2 mg/dL (ref 8.9–10.3)
CHLORIDE: 110 mmol/L (ref 101–111)
CO2: 21 mmol/L — AB (ref 22–32)
Creatinine, Ser: 0.61 mg/dL (ref 0.44–1.00)
GFR calc Af Amer: >60 mL/min (ref >60)
GFR calc non Af Amer: >60 mL/min (ref >60)
GLUCOSE: 97 mg/dL (ref 65–99)
POTASSIUM: 3.8 mmol/L (ref 3.5–5.1)
Sodium: 140 mmol/L (ref 135–145)

## 2016-12-12 LAB — GLUCOSE, CAPILLARY
GLUCOSE-CAPILLARY: 121 mg/dL — AB (ref 65–99)
GLUCOSE-CAPILLARY: 118 mg/dL — AB (ref 65–99)
GLUCOSE-CAPILLARY: 126 mg/dL — AB (ref 65–99)
GLUCOSE-CAPILLARY: 119 mg/dL — AB (ref 65–99)

## 2016-12-12 LAB — POCT I-STAT 4, (NA,K, GLUC, HGB,HCT)
Glucose, Bld: 112 mg/dL — ABNORMAL HIGH (ref 65–99)
HEMATOCRIT: 34 % — AB (ref 36.0–46.0)
Hemoglobin: 11.6 g/dL — ABNORMAL LOW (ref 12.0–15.0)
Potassium: 3.5 mmol/L (ref 3.5–5.1)
Sodium: 142 mmol/L (ref 135–145)

## 2016-12-12 LAB — CREATININE, SERUM
Creatinine, Ser: 0.51 mg/dL (ref 0.44–1.00)
GFR calc Af Amer: >60 mL/min (ref >60)
GFR calc non Af Amer: >60 mL/min (ref >60)

## 2016-12-12 LAB — MAGNESIUM: Magnesium: 2.9 mg/dL — ABNORMAL HIGH (ref 1.7–2.4)

## 2016-12-12 LAB — APTT
APTT: 78 s — AB (ref 24–36)
aPTT: 28 seconds (ref 24–36)

## 2016-12-12 LAB — HEMOGLOBIN AND HEMATOCRIT, BLOOD
HCT: 27.8 % — ABNORMAL LOW (ref 36.0–46.0)
Hemoglobin: 9.7 g/dL — ABNORMAL LOW (ref 12.0–15.0)

## 2016-12-12 LAB — TSH: TSH: 4.635 u[IU]/mL — ABNORMAL HIGH (ref 0.350–4.500)

## 2016-12-12 LAB — PLATELET COUNT: Platelets: 191 10*3/uL (ref 150–400)

## 2016-12-12 LAB — PROTIME-INR
INR: 1.33
PROTHROMBIN TIME: 16.4 s — AB (ref 11.4–15.2)

## 2016-12-12 SURGERY — CORONARY ARTERY BYPASS GRAFTING (CABG)
Anesthesia: General | Site: Chest

## 2016-12-12 MED ORDER — FENTANYL CITRATE (PF) 250 MCG/5ML IJ SOLN
INTRAMUSCULAR | Status: AC
  Filled 2016-12-12: qty 5

## 2016-12-12 MED ORDER — DESMOPRESSIN ACETATE 4 MCG/ML IJ SOLN
20.0000 ug | INTRAMUSCULAR | Status: AC
  Administered 2016-12-12: 20 ug via INTRAVENOUS
  Filled 2016-12-12: qty 5

## 2016-12-12 MED ORDER — FENTANYL CITRATE (PF) 250 MCG/5ML IJ SOLN
INTRAMUSCULAR | Status: AC
  Filled 2016-12-12: qty 15

## 2016-12-12 MED ORDER — MAGNESIUM SULFATE 4 GM/100ML IV SOLN
4.0000 g | Freq: Once | INTRAVENOUS | Status: AC
  Administered 2016-12-12: 4 g via INTRAVENOUS
  Filled 2016-12-12: qty 100

## 2016-12-12 MED ORDER — MILRINONE LACTATE IN DEXTROSE 20-5 MG/100ML-% IV SOLN: 0.3000 ug/kg/min | INTRAVENOUS | Status: DC

## 2016-12-12 MED ORDER — SODIUM CHLORIDE 0.9 % IJ SOLN
INTRAMUSCULAR | Status: AC
  Filled 2016-12-12: qty 10

## 2016-12-12 MED ORDER — LACTATED RINGERS IV SOLN
INTRAVENOUS | Status: DC
Start: 2016-12-12 — End: 2016-12-13

## 2016-12-12 MED ORDER — ALBUMIN HUMAN 5 % IV SOLN
250.0000 mL | INTRAVENOUS | Status: AC | PRN
  Administered 2016-12-12 (×2): 250 mL via INTRAVENOUS

## 2016-12-12 MED ORDER — THROMBIN 5000 UNITS EX SOLR
CUTANEOUS | Status: AC
  Filled 2016-12-12: qty 5000

## 2016-12-12 MED ORDER — LACTATED RINGERS IV SOLN
INTRAVENOUS | Status: DC | PRN
  Administered 2016-12-12: 08:00:00 via INTRAVENOUS

## 2016-12-12 MED ORDER — INSULIN REGULAR BOLUS VIA INFUSION
0.0000 [IU] | Freq: Three times a day (TID) | INTRAVENOUS | Status: DC
  Filled 2016-12-12: qty 10

## 2016-12-12 MED ORDER — OXYCODONE HCL 5 MG PO TABS
5.0000 mg | ORAL_TABLET | ORAL | Status: DC | PRN
  Administered 2016-12-13: 10 mg via ORAL
  Filled 2016-12-12: qty 2

## 2016-12-12 MED ORDER — LIDOCAINE 2% (20 MG/ML) 5 ML SYRINGE: INTRAMUSCULAR | Status: DC | PRN

## 2016-12-12 MED ORDER — HEPARIN SODIUM (PORCINE) 1000 UNIT/ML IJ SOLN
INTRAMUSCULAR | Status: DC | PRN
  Administered 2016-12-12: 24000 [IU] via INTRAVENOUS
  Administered 2016-12-12 (×2): 2000 [IU] via INTRAVENOUS

## 2016-12-12 MED ORDER — MIDAZOLAM HCL 10 MG/2ML IJ SOLN
INTRAMUSCULAR | Status: AC
  Filled 2016-12-12: qty 2

## 2016-12-12 MED ORDER — DIPHENHYDRAMINE HCL 50 MG/ML IJ SOLN
INTRAMUSCULAR | Status: AC
  Filled 2016-12-12: qty 1

## 2016-12-12 MED ORDER — PANTOPRAZOLE SODIUM 40 MG PO TBEC
40.0000 mg | DELAYED_RELEASE_TABLET | Freq: Every day | ORAL | Status: DC
  Administered 2016-12-13 – 2016-12-17 (×5): 40 mg via ORAL
  Filled 2016-12-12 (×5): qty 1

## 2016-12-12 MED ORDER — PROTAMINE SULFATE 10 MG/ML IV SOLN
INTRAVENOUS | Status: AC
  Filled 2016-12-12: qty 5

## 2016-12-12 MED ORDER — MORPHINE SULFATE (PF) 4 MG/ML IV SOLN
1.0000 mg | INTRAVENOUS | Status: DC | PRN
  Administered 2016-12-12: 4 mg via INTRAVENOUS
  Filled 2016-12-12: qty 1

## 2016-12-12 MED ORDER — 0.9 % SODIUM CHLORIDE (POUR BTL) OPTIME
TOPICAL | Status: DC | PRN
  Administered 2016-12-12: 6000 mL

## 2016-12-12 MED ORDER — MIDAZOLAM HCL 2 MG/2ML IJ SOLN: 2.0000 mg | INTRAMUSCULAR | Status: DC | PRN

## 2016-12-12 MED ORDER — ROCURONIUM BROMIDE 100 MG/10ML IV SOLN
INTRAVENOUS | Status: DC | PRN
  Administered 2016-12-12: 40 mg via INTRAVENOUS
  Administered 2016-12-12: 60 mg via INTRAVENOUS
  Administered 2016-12-12 (×2): 50 mg via INTRAVENOUS

## 2016-12-12 MED ORDER — ALBUMIN HUMAN 5 % IV SOLN
INTRAVENOUS | Status: DC | PRN
  Administered 2016-12-12: 13:00:00 via INTRAVENOUS

## 2016-12-12 MED ORDER — NITROGLYCERIN IN D5W 200-5 MCG/ML-% IV SOLN: 0.0000 ug/min | INTRAVENOUS | Status: DC

## 2016-12-12 MED ORDER — CHLORHEXIDINE GLUCONATE 0.12% ORAL RINSE (MEDLINE KIT)
15.0000 mL | Freq: Two times a day (BID) | OROMUCOSAL | Status: DC
  Administered 2016-12-12 – 2016-12-13 (×2): 15 mL via OROMUCOSAL

## 2016-12-12 MED ORDER — METOPROLOL TARTRATE 12.5 MG HALF TABLET
12.5000 mg | ORAL_TABLET | Freq: Two times a day (BID) | ORAL | Status: DC
  Administered 2016-12-13 – 2016-12-17 (×9): 12.5 mg via ORAL
  Filled 2016-12-12 (×9): qty 1

## 2016-12-12 MED ORDER — SODIUM CHLORIDE 0.9 % IV SOLN: 250.0000 mL | INTRAVENOUS | Status: DC

## 2016-12-12 MED ORDER — PHENYLEPHRINE HCL 10 MG/ML IJ SOLN
INTRAVENOUS | Status: DC | PRN
  Administered 2016-12-12: 20 ug/min via INTRAVENOUS
  Administered 2016-12-12: 25 ug/min via INTRAVENOUS

## 2016-12-12 MED ORDER — DEXMEDETOMIDINE HCL IN NACL 400 MCG/100ML IV SOLN: 0.0000 ug/kg/h | INTRAVENOUS | Status: DC

## 2016-12-12 MED ORDER — POTASSIUM CHLORIDE 10 MEQ/50ML IV SOLN
10.0000 meq | INTRAVENOUS | Status: AC | PRN
  Administered 2016-12-12: 10 meq via INTRAVENOUS

## 2016-12-12 MED ORDER — MIDAZOLAM HCL 2 MG/2ML IJ SOLN
INTRAMUSCULAR | Status: AC
  Filled 2016-12-12: qty 2

## 2016-12-12 MED ORDER — ACETAMINOPHEN 650 MG RE SUPP
650.0000 mg | Freq: Once | RECTAL | Status: AC
  Administered 2016-12-12: 650 mg via RECTAL

## 2016-12-12 MED ORDER — DOCUSATE SODIUM 100 MG PO CAPS
200.0000 mg | ORAL_CAPSULE | Freq: Every day | ORAL | Status: DC
  Administered 2016-12-13 – 2016-12-14 (×2): 200 mg via ORAL
  Filled 2016-12-12 (×2): qty 2

## 2016-12-12 MED ORDER — TRAMADOL HCL 50 MG PO TABS
50.0000 mg | ORAL_TABLET | ORAL | Status: DC | PRN
  Administered 2016-12-12 – 2016-12-13 (×2): 50 mg via ORAL
  Filled 2016-12-12: qty 1
  Filled 2016-12-12: qty 2

## 2016-12-12 MED ORDER — SODIUM CHLORIDE 0.9% FLUSH: 3.0000 mL | INTRAVENOUS | Status: DC | PRN

## 2016-12-12 MED ORDER — LACTATED RINGERS IV SOLN: 500.0000 mL | Freq: Once | INTRAVENOUS | Status: DC | PRN

## 2016-12-12 MED ORDER — SODIUM CHLORIDE 0.9 % IV SOLN
INTRAVENOUS | Status: DC
  Filled 2016-12-12: qty 1

## 2016-12-12 MED ORDER — ORAL CARE MOUTH RINSE
15.0000 mL | Freq: Two times a day (BID) | OROMUCOSAL | Status: DC
  Administered 2016-12-13: 15 mL via OROMUCOSAL

## 2016-12-12 MED ORDER — LIDOCAINE 2% (20 MG/ML) 5 ML SYRINGE
INTRAMUSCULAR | Status: AC
  Filled 2016-12-12: qty 10

## 2016-12-12 MED ORDER — SODIUM CHLORIDE 0.9% FLUSH
3.0000 mL | Freq: Two times a day (BID) | INTRAVENOUS | Status: DC
  Administered 2016-12-13 – 2016-12-16 (×5): 3 mL via INTRAVENOUS

## 2016-12-12 MED ORDER — METOPROLOL TARTRATE 5 MG/5ML IV SOLN: 2.5000 mg | INTRAVENOUS | Status: DC | PRN

## 2016-12-12 MED ORDER — ONDANSETRON HCL 4 MG/2ML IJ SOLN
4.0000 mg | Freq: Four times a day (QID) | INTRAMUSCULAR | Status: DC | PRN
  Administered 2016-12-12 – 2016-12-13 (×3): 4 mg via INTRAVENOUS
  Filled 2016-12-12 (×3): qty 2

## 2016-12-12 MED ORDER — MIDAZOLAM HCL 5 MG/5ML IJ SOLN
INTRAMUSCULAR | Status: DC | PRN
  Administered 2016-12-12: 4 mg via INTRAVENOUS
  Administered 2016-12-12 (×2): 1 mg via INTRAVENOUS
  Administered 2016-12-12 (×2): 2 mg via INTRAVENOUS

## 2016-12-12 MED ORDER — BISACODYL 10 MG RE SUPP: 10.0000 mg | Freq: Every day | RECTAL | Status: DC

## 2016-12-12 MED ORDER — HEMOSTATIC AGENTS (NO CHARGE) OPTIME
TOPICAL | Status: DC | PRN
  Administered 2016-12-12 (×2): 1 via TOPICAL

## 2016-12-12 MED ORDER — METOCLOPRAMIDE HCL 5 MG/ML IJ SOLN
10.0000 mg | Freq: Four times a day (QID) | INTRAMUSCULAR | Status: DC
  Administered 2016-12-12 – 2016-12-15 (×13): 10 mg via INTRAVENOUS
  Filled 2016-12-12 (×14): qty 2

## 2016-12-12 MED ORDER — SODIUM CHLORIDE 0.45 % IV SOLN: INTRAVENOUS | Status: DC | PRN

## 2016-12-12 MED ORDER — PROTAMINE SULFATE 10 MG/ML IV SOLN
INTRAVENOUS | Status: AC
  Filled 2016-12-12: qty 25

## 2016-12-12 MED ORDER — DOPAMINE-DEXTROSE 3.2-5 MG/ML-% IV SOLN: 2.0000 ug/kg/min | INTRAVENOUS | Status: DC

## 2016-12-12 MED ORDER — FENTANYL CITRATE (PF) 250 MCG/5ML IJ SOLN
INTRAMUSCULAR | Status: DC | PRN
  Administered 2016-12-12: 100 ug via INTRAVENOUS
  Administered 2016-12-12 (×2): 50 ug via INTRAVENOUS
  Administered 2016-12-12: 150 ug via INTRAVENOUS
  Administered 2016-12-12: 50 ug via INTRAVENOUS
  Administered 2016-12-12: 250 ug via INTRAVENOUS
  Administered 2016-12-12 (×3): 100 ug via INTRAVENOUS
  Administered 2016-12-12: 50 ug via INTRAVENOUS
  Administered 2016-12-12 (×5): 100 ug via INTRAVENOUS

## 2016-12-12 MED ORDER — LACTATED RINGERS IV SOLN: INTRAVENOUS | Status: DC

## 2016-12-12 MED ORDER — THROMBIN 5000 UNITS EX SOLR
CUTANEOUS | Status: DC | PRN
  Administered 2016-12-12: 5000 [IU] via TOPICAL

## 2016-12-12 MED ORDER — ROCURONIUM BROMIDE 10 MG/ML (PF) SYRINGE
PREFILLED_SYRINGE | INTRAVENOUS | Status: AC
  Filled 2016-12-12: qty 10

## 2016-12-12 MED ORDER — ACETAMINOPHEN 160 MG/5ML PO SOLN
1000.0000 mg | Freq: Four times a day (QID) | ORAL | Status: DC
  Filled 2016-12-12: qty 40.6

## 2016-12-12 MED ORDER — SODIUM CHLORIDE 0.9 % IJ SOLN
INTRAMUSCULAR | Status: DC | PRN
  Administered 2016-12-12 (×3): 4 mL via TOPICAL

## 2016-12-12 MED ORDER — MORPHINE SULFATE (PF) 4 MG/ML IV SOLN: 2.0000 mg | INTRAVENOUS | Status: DC | PRN

## 2016-12-12 MED ORDER — METOPROLOL TARTRATE 25 MG/10 ML ORAL SUSPENSION: 12.5000 mg | Freq: Two times a day (BID) | ORAL | Status: DC

## 2016-12-12 MED ORDER — CHLORHEXIDINE GLUCONATE 0.12 % MT SOLN
15.0000 mL | OROMUCOSAL | Status: AC
  Administered 2016-12-12: 15 mL via OROMUCOSAL

## 2016-12-12 MED ORDER — FAMOTIDINE IN NACL 20-0.9 MG/50ML-% IV SOLN
20.0000 mg | Freq: Two times a day (BID) | INTRAVENOUS | Status: DC
  Administered 2016-12-12: 20 mg via INTRAVENOUS

## 2016-12-12 MED ORDER — ACETAMINOPHEN 500 MG PO TABS
1000.0000 mg | ORAL_TABLET | Freq: Four times a day (QID) | ORAL | Status: DC
  Administered 2016-12-12 – 2016-12-17 (×15): 1000 mg via ORAL
  Filled 2016-12-12 (×15): qty 2

## 2016-12-12 MED ORDER — PROPOFOL 10 MG/ML IV BOLUS
INTRAVENOUS | Status: DC | PRN
  Administered 2016-12-12: 20 mg via INTRAVENOUS
  Administered 2016-12-12: 10 mg via INTRAVENOUS

## 2016-12-12 MED ORDER — ASPIRIN 81 MG PO CHEW: 324.0000 mg | CHEWABLE_TABLET | Freq: Every day | ORAL | Status: DC

## 2016-12-12 MED ORDER — PROPOFOL 10 MG/ML IV BOLUS
INTRAVENOUS | Status: AC
  Filled 2016-12-12: qty 20

## 2016-12-12 MED ORDER — VANCOMYCIN HCL IN DEXTROSE 1-5 GM/200ML-% IV SOLN
1000.0000 mg | Freq: Once | INTRAVENOUS | Status: AC
  Administered 2016-12-12: 1000 mg via INTRAVENOUS
  Filled 2016-12-12: qty 200

## 2016-12-12 MED ORDER — DEXTROSE 5 % IV SOLN
1.5000 g | Freq: Two times a day (BID) | INTRAVENOUS | Status: AC
  Administered 2016-12-12 – 2016-12-14 (×4): 1.5 g via INTRAVENOUS
  Filled 2016-12-12 (×4): qty 1.5

## 2016-12-12 MED ORDER — ACETAMINOPHEN 160 MG/5ML PO SOLN: 650.0000 mg | Freq: Once | ORAL | Status: AC

## 2016-12-12 MED ORDER — MORPHINE SULFATE (PF) 4 MG/ML IV SOLN
2.0000 mg | INTRAVENOUS | Status: DC | PRN
  Administered 2016-12-13: 4 mg via INTRAVENOUS
  Filled 2016-12-12: qty 1

## 2016-12-12 MED ORDER — INSULIN ASPART 100 UNIT/ML ~~LOC~~ SOLN
0.0000 [IU] | SUBCUTANEOUS | Status: DC
  Administered 2016-12-12 – 2016-12-13 (×2): 2 [IU] via SUBCUTANEOUS

## 2016-12-12 MED ORDER — SODIUM CHLORIDE 0.9 % IV SOLN: INTRAVENOUS | Status: DC

## 2016-12-12 MED ORDER — ORAL CARE MOUTH RINSE: 15.0000 mL | Freq: Four times a day (QID) | OROMUCOSAL | Status: DC

## 2016-12-12 MED ORDER — MORPHINE SULFATE (PF) 4 MG/ML IV SOLN: 1.0000 mg | INTRAVENOUS | Status: DC | PRN

## 2016-12-12 MED ORDER — DIPHENHYDRAMINE HCL 50 MG/ML IJ SOLN
INTRAMUSCULAR | Status: DC | PRN
  Administered 2016-12-12: 25 mg via INTRAVENOUS

## 2016-12-12 MED ORDER — LIDOCAINE HCL (CARDIAC) 20 MG/ML IV SOLN
INTRAVENOUS | Status: DC | PRN
  Administered 2016-12-12: 40 mg via INTRAVENOUS

## 2016-12-12 MED ORDER — SODIUM CHLORIDE 0.9 % IV SOLN
0.0000 ug/min | INTRAVENOUS | Status: DC
  Filled 2016-12-12: qty 2

## 2016-12-12 MED ORDER — ASPIRIN EC 325 MG PO TBEC
325.0000 mg | DELAYED_RELEASE_TABLET | Freq: Every day | ORAL | Status: DC
  Administered 2016-12-13 – 2016-12-17 (×5): 325 mg via ORAL
  Filled 2016-12-12 (×5): qty 1

## 2016-12-12 MED ORDER — POTASSIUM CHLORIDE 10 MEQ/50ML IV SOLN
10.0000 meq | INTRAVENOUS | Status: AC
  Administered 2016-12-12 (×3): 10 meq via INTRAVENOUS
  Filled 2016-12-12: qty 50

## 2016-12-12 MED ORDER — PROTAMINE SULFATE 10 MG/ML IV SOLN
INTRAVENOUS | Status: DC | PRN
  Administered 2016-12-12 (×2): 50 mg via INTRAVENOUS
  Administered 2016-12-12: 70 mg via INTRAVENOUS
  Administered 2016-12-12 (×2): 50 mg via INTRAVENOUS
  Administered 2016-12-12: 10 mg via INTRAVENOUS

## 2016-12-12 MED ORDER — BISACODYL 5 MG PO TBEC
10.0000 mg | DELAYED_RELEASE_TABLET | Freq: Every day | ORAL | Status: DC
  Administered 2016-12-13 – 2016-12-14 (×2): 10 mg via ORAL
  Filled 2016-12-12 (×2): qty 2

## 2016-12-12 MED ORDER — HEPARIN SODIUM (PORCINE) 1000 UNIT/ML IJ SOLN
INTRAMUSCULAR | Status: AC
  Filled 2016-12-12: qty 2

## 2016-12-12 MED ORDER — LACTATED RINGERS IV SOLN
INTRAVENOUS | Status: DC | PRN
  Administered 2016-12-12 (×2): via INTRAVENOUS

## 2016-12-12 MED FILL — Lidocaine HCl Local Inj 2%: INTRAMUSCULAR | Qty: 10 | Status: AC

## 2016-12-12 SURGICAL SUPPLY — 115 items
ADAPTER CARDIO PERF ANTE/RETRO (ADAPTER) ×4 IMPLANT
BAG DECANTER FOR FLEXI CONT (MISCELLANEOUS) ×4 IMPLANT
BANDAGE ACE 4X5 VEL STRL LF (GAUZE/BANDAGES/DRESSINGS) ×4 IMPLANT
BANDAGE ACE 6X5 VEL STRL LF (GAUZE/BANDAGES/DRESSINGS) ×4 IMPLANT
BASKET HEART  (ORDER IN 25'S) (MISCELLANEOUS) ×1
BASKET HEART (ORDER IN 25'S) (MISCELLANEOUS) ×1
BASKET HEART (ORDER IN 25S) (MISCELLANEOUS) ×2 IMPLANT
BLADE CLIPPER SURG (BLADE) IMPLANT
BLADE NEEDLE 3 SS STRL (BLADE) ×3 IMPLANT
BLADE NEEDLE 3MM SS STRL (BLADE) ×1
BLADE STERNUM SYSTEM 6 (BLADE) ×4 IMPLANT
BLADE SURG 11 STRL SS (BLADE) ×8 IMPLANT
BLADE SURG 12 STRL SS (BLADE) ×4 IMPLANT
BLADE SURG ROTATE 9660 (MISCELLANEOUS) ×4 IMPLANT
BNDG GAUZE ELAST 4 BULKY (GAUZE/BANDAGES/DRESSINGS) ×4 IMPLANT
CANISTER SUCT 3000ML PPV (MISCELLANEOUS) ×4 IMPLANT
CANNULA GUNDRY RCSP 15FR (MISCELLANEOUS) ×4 IMPLANT
CATH CPB KIT VANTRIGT (MISCELLANEOUS) ×4 IMPLANT
CATH ROBINSON RED A/P 18FR (CATHETERS) ×12 IMPLANT
CATH THORACIC 36FR RT ANG (CATHETERS) ×4 IMPLANT
CLIP FOGARTY SPRING 6M (CLIP) ×4 IMPLANT
CLIP LIGATING EXTRA MED SLVR (CLIP) ×8 IMPLANT
CLIP VESOCCLUDE SM WIDE 24/CT (CLIP) ×4 IMPLANT
COVER MAYO STAND STRL (DRAPES) ×4 IMPLANT
CRADLE DONUT ADULT HEAD (MISCELLANEOUS) ×4 IMPLANT
DERMABOND ADHESIVE PROPEN (GAUZE/BANDAGES/DRESSINGS) ×2
DERMABOND ADVANCED .7 DNX6 (GAUZE/BANDAGES/DRESSINGS) ×2 IMPLANT
DRAIN CHANNEL 32F RND 10.7 FF (WOUND CARE) ×4 IMPLANT
DRAPE CARDIOVASCULAR INCISE (DRAPES) ×2
DRAPE SLUSH/WARMER DISC (DRAPES) ×4 IMPLANT
DRAPE SRG 135X102X78XABS (DRAPES) ×2 IMPLANT
DRSG AQUACEL AG ADV 3.5X14 (GAUZE/BANDAGES/DRESSINGS) ×4 IMPLANT
DRSG COVADERM 4X14 (GAUZE/BANDAGES/DRESSINGS) ×4 IMPLANT
ELECT BLADE 4.0 EZ CLEAN MEGAD (MISCELLANEOUS) ×4
ELECT BLADE 6.5 EXT (BLADE) ×4 IMPLANT
ELECT CAUTERY BLADE 6.4 (BLADE) ×4 IMPLANT
ELECT REM PT RETURN 9FT ADLT (ELECTROSURGICAL) ×8
ELECTRODE BLDE 4.0 EZ CLN MEGD (MISCELLANEOUS) ×2 IMPLANT
ELECTRODE REM PT RTRN 9FT ADLT (ELECTROSURGICAL) ×4 IMPLANT
FELT TEFLON 1X6 (MISCELLANEOUS) ×8 IMPLANT
GAUZE SPONGE 4X4 12PLY STRL (GAUZE/BANDAGES/DRESSINGS) ×4 IMPLANT
GAUZE SPONGE 4X4 12PLY STRL LF (GAUZE/BANDAGES/DRESSINGS) ×4 IMPLANT
GLOVE BIO SURGEON STRL SZ 6.5 (GLOVE) ×18 IMPLANT
GLOVE BIO SURGEON STRL SZ7 (GLOVE) ×20 IMPLANT
GLOVE BIO SURGEON STRL SZ7.5 (GLOVE) ×12 IMPLANT
GLOVE BIO SURGEONS STRL SZ 6.5 (GLOVE) ×6
GOWN STRL REUS W/ TWL LRG LVL3 (GOWN DISPOSABLE) ×20 IMPLANT
GOWN STRL REUS W/TWL LRG LVL3 (GOWN DISPOSABLE) ×20
HEMOSTAT POWDER SURGIFOAM 1G (HEMOSTASIS) ×12 IMPLANT
HEMOSTAT SURGICEL 2X14 (HEMOSTASIS) ×4 IMPLANT
INSERT FOGARTY XLG (MISCELLANEOUS) IMPLANT
KIT BASIN OR (CUSTOM PROCEDURE TRAY) ×4 IMPLANT
KIT ROOM TURNOVER OR (KITS) ×4 IMPLANT
KIT SUCTION CATH 14FR (SUCTIONS) ×4 IMPLANT
KIT VASOVIEW HEMOPRO VH 3000 (KITS) ×4 IMPLANT
LEAD PACING MYOCARDI (MISCELLANEOUS) ×4 IMPLANT
MARKER GRAFT CORONARY BYPASS (MISCELLANEOUS) ×12 IMPLANT
NS IRRIG 1000ML POUR BTL (IV SOLUTION) ×24 IMPLANT
PACK OPEN HEART (CUSTOM PROCEDURE TRAY) ×4 IMPLANT
PAD ARMBOARD 7.5X6 YLW CONV (MISCELLANEOUS) ×8 IMPLANT
PAD ELECT DEFIB RADIOL ZOLL (MISCELLANEOUS) ×4 IMPLANT
PENCIL BUTTON HOLSTER BLD 10FT (ELECTRODE) ×4 IMPLANT
PUNCH AORTIC ROT 4.0MM RCL 40 (MISCELLANEOUS) ×4 IMPLANT
PUNCH AORTIC ROTATE 4.0MM (MISCELLANEOUS) IMPLANT
PUNCH AORTIC ROTATE 4.5MM 8IN (MISCELLANEOUS) IMPLANT
PUNCH AORTIC ROTATE 5MM 8IN (MISCELLANEOUS) IMPLANT
SET CARDIOPLEGIA MPS 5001102 (MISCELLANEOUS) ×4 IMPLANT
SPONGE LAP 18X18 X RAY DECT (DISPOSABLE) ×8 IMPLANT
SPONGE LAP 4X18 X RAY DECT (DISPOSABLE) ×4 IMPLANT
SURGIFLO W/THROMBIN 8M KIT (HEMOSTASIS) ×4 IMPLANT
SUT BONE WAX W31G (SUTURE) ×4 IMPLANT
SUT ETHIBOND 2 0 SH (SUTURE) ×8
SUT ETHIBOND 2 0 SH 36X2 (SUTURE) ×8 IMPLANT
SUT ETHILON 3 0 FSL (SUTURE) ×8 IMPLANT
SUT MNCRL AB 4-0 PS2 18 (SUTURE) ×4 IMPLANT
SUT PROLENE 3 0 SH DA (SUTURE) IMPLANT
SUT PROLENE 3 0 SH1 36 (SUTURE) IMPLANT
SUT PROLENE 4 0 RB 1 (SUTURE) ×8
SUT PROLENE 4 0 SH DA (SUTURE) ×4 IMPLANT
SUT PROLENE 4-0 RB1 .5 CRCL 36 (SUTURE) ×8 IMPLANT
SUT PROLENE 5 0 C 1 36 (SUTURE) ×8 IMPLANT
SUT PROLENE 6 0 C 1 30 (SUTURE) ×24 IMPLANT
SUT PROLENE 6 0 CC (SUTURE) ×24 IMPLANT
SUT PROLENE 8 0 BV175 6 (SUTURE) ×24 IMPLANT
SUT PROLENE BLUE 7 0 (SUTURE) ×8 IMPLANT
SUT SILK  1 MH (SUTURE) ×6
SUT SILK 1 MH (SUTURE) ×6 IMPLANT
SUT SILK 1 TIES 10X30 (SUTURE) ×4 IMPLANT
SUT SILK 2 0 SH CR/8 (SUTURE) ×12 IMPLANT
SUT SILK 2 0 TIES 10X30 (SUTURE) ×4 IMPLANT
SUT SILK 2 0 TIES 17X18 (SUTURE) ×2
SUT SILK 2-0 18XBRD TIE BLK (SUTURE) ×2 IMPLANT
SUT SILK 3 0 SH CR/8 (SUTURE) ×8 IMPLANT
SUT SILK 4 0 TIE 10X30 (SUTURE) ×8 IMPLANT
SUT STEEL 6MS V (SUTURE) ×8 IMPLANT
SUT STEEL SZ 6 DBL 3X14 BALL (SUTURE) ×4 IMPLANT
SUT TEM PAC WIRE 2 0 SH (SUTURE) ×16 IMPLANT
SUT VIC AB 1 CTX 36 (SUTURE) ×4
SUT VIC AB 1 CTX36XBRD ANBCTR (SUTURE) ×4 IMPLANT
SUT VIC AB 2-0 CT1 27 (SUTURE) ×4
SUT VIC AB 2-0 CT1 TAPERPNT 27 (SUTURE) ×4 IMPLANT
SUT VIC AB 2-0 CTX 27 (SUTURE) ×4 IMPLANT
SUT VIC AB 3-0 X1 27 (SUTURE) ×8 IMPLANT
SUTURE E-PAK OPEN HEART (SUTURE) IMPLANT
SYSTEM SAHARA CHEST DRAIN ATS (WOUND CARE) ×4 IMPLANT
TAPE CLOTH SURG 4X10 WHT LF (GAUZE/BANDAGES/DRESSINGS) ×4 IMPLANT
TAPE PAPER 2X10 WHT MICROPORE (GAUZE/BANDAGES/DRESSINGS) ×4 IMPLANT
TOWEL GREEN STERILE (TOWEL DISPOSABLE) ×16 IMPLANT
TOWEL GREEN STERILE FF (TOWEL DISPOSABLE) ×8 IMPLANT
TOWEL OR 17X24 6PK STRL BLUE (TOWEL DISPOSABLE) ×8 IMPLANT
TOWEL OR 17X26 10 PK STRL BLUE (TOWEL DISPOSABLE) ×8 IMPLANT
TRAY FOLEY SILVER 16FR TEMP (SET/KITS/TRAYS/PACK) ×4 IMPLANT
TUBING INSUFFLATION (TUBING) ×4 IMPLANT
UNDERPAD 30X30 (UNDERPADS AND DIAPERS) ×4 IMPLANT
WATER STERILE IRR 1000ML POUR (IV SOLUTION) ×8 IMPLANT

## 2016-12-12 NOTE — Progress Notes (Signed)
Initiated Open Heart Rapid Wean per Protocol 

## 2016-12-12 NOTE — Anesthesia Procedure Notes (Addendum)
Arterial Line Insertion Start/End10/04/2016 7:00 AM, 12/12/2016 7:10 AM Performed by: Leonides Grills, anesthesiologist  Patient location: Pre-op. Preanesthetic checklist: patient identified, IV checked, site marked, risks and benefits discussed, surgical consent, monitors and equipment checked, pre-op evaluation, timeout performed and anesthesia consent Lidocaine 1% used for infiltration and patient sedated Left, radial was placed Catheter size: 20 Fr Hand hygiene performed  and maximum sterile barriers used   Attempts: 1 Procedure performed using ultrasound guided technique. Ultrasound Notes:anatomy identified, needle tip was noted to be adjacent to the nerve/plexus identified and no ultrasound evidence of intravascular and/or intraneural injection Following insertion, dressing applied and Biopatch. Post procedure assessment: normal and unchanged  Patient tolerated the procedure well with no immediate complications.

## 2016-12-12 NOTE — Progress Notes (Signed)
  Echocardiogram Echocardiogram Transesophageal has been performed.  Monica Lynch 12/12/2016, 9:07 AM

## 2016-12-12 NOTE — Anesthesia Procedure Notes (Signed)
Procedure Name: Intubation Date/Time: 12/12/2016 7:54 AM Performed by: Marena Chancy Pre-anesthesia Checklist: Patient identified, Emergency Drugs available, Suction available and Patient being monitored Patient Re-evaluated:Patient Re-evaluated prior to induction Oxygen Delivery Method: Circle System Utilized Preoxygenation: Pre-oxygenation with 100% oxygen Induction Type: IV induction Ventilation: Mask ventilation without difficulty Laryngoscope Size: Miller and 2 Grade View: Grade I Tube type: Oral Tube size: 7.5 mm Number of attempts: 1 Airway Equipment and Method: Stylet Placement Confirmation: ETT inserted through vocal cords under direct vision,  positive ETCO2 and breath sounds checked- equal and bilateral Secured at: 21 cm Tube secured with: Tape Dental Injury: Teeth and Oropharynx as per pre-operative assessment

## 2016-12-12 NOTE — OR Nursing (Signed)
12:50 - 45 minute call to SICU 13:20 - 20 minute call to SICU

## 2016-12-12 NOTE — Anesthesia Preprocedure Evaluation (Addendum)
Anesthesia Evaluation  Patient identified by MRN, date of birth, ID band Patient awake    Reviewed: Allergy & Precautions, NPO status , Patient's Chart, lab work & pertinent test results, reviewed documented beta blocker date and time   History of Anesthesia Complications Negative for: history of anesthetic complications  Airway Mallampati: II  TM Distance: >3 FB Neck ROM: Full    Dental  (+) Dental Advisory Given   Pulmonary asthma ,    breath sounds clear to auscultation       Cardiovascular hypertension, Pt. on medications and Pt. on home beta blockers + angina + CAD   Rhythm:Regular     Neuro/Psych negative neurological ROS  negative psych ROS   GI/Hepatic negative GI ROS, Neg liver ROS,   Endo/Other  negative endocrine ROS  Renal/GU negative Renal ROS     Musculoskeletal  (+) Arthritis ,   Abdominal   Peds  Hematology negative hematology ROS (+)   Anesthesia Other Findings   Reproductive/Obstetrics                            Anesthesia Physical Anesthesia Plan  ASA: IV  Anesthesia Plan: General   Post-op Pain Management:    Induction: Intravenous  PONV Risk Score and Plan: 3 and Treatment may vary due to age or medical condition  Airway Management Planned: Oral ETT  Additional Equipment: Arterial line, CVP, PA Cath, TEE and Ultrasound Guidance Line Placement  Intra-op Plan:   Post-operative Plan: Post-operative intubation/ventilation  Informed Consent: I have reviewed the patients History and Physical, chart, labs and discussed the procedure including the risks, benefits and alternatives for the proposed anesthesia with the patient or authorized representative who has indicated his/her understanding and acceptance.   Dental advisory given  Plan Discussed with: CRNA and Surgeon  Anesthesia Plan Comments:         Anesthesia Quick Evaluation

## 2016-12-12 NOTE — Procedures (Signed)
Extubation Procedure Note  Patient Details:   Name: Monica Lynch DOB: June 19, 1960 MRN: [id number]   Airway Documentation:     Evaluation  O2 sats: stable throughout Complications: No apparent complications Patient did tolerate procedure well. Bilateral Breath Sounds: Diminished   Yes   Pt extubated to 4L [place] per Open Heart Rapid Wean Protocol. Pt stable throughout with no complications. Pt NIF -25, VC 0.8 L, ABG within normal limits. Pt able to speak and has strong non productive cough post extubation. Pt instructed on use of IS and encouraged to use Y[place]auer to clear secretions. RT will continue to monitor.   Ermalinda Barrios M 12/12/2016, 5:07 PM

## 2016-12-12 NOTE — Progress Notes (Signed)
Pre Procedure note for inpatients:   Monica Lynch has been scheduled for Procedure(s): CORONARY ARTERY BYPASS GRAFTING (CABG) (N/A) TRANSESOPHAGEAL ECHOCARDIOGRAM (TEE) (N/A) today. The various methods of treatment have been discussed with the patient. After consideration of the risks, benefits and treatment options the patient has consented to the planned procedure.   The patient has been seen and labs reviewed. There are no changes in the patient's condition to prevent proceeding with the planned procedure today.  Recent labs:  Lab Results  Component Value Date   WBC 6.9 12/12/2016   HGB 13.8 12/12/2016   HCT 40.5 12/12/2016   PLT 274 12/12/2016   GLUCOSE 97 12/12/2016   CHOL 213 (H) 12/01/2016   TRIG 129 12/01/2016   HDL 50 12/01/2016   LDLCALC 137 (H) 12/01/2016   ALT 22 12/11/2016   AST 28 12/11/2016   NA 140 12/12/2016   K 3.8 12/12/2016   CL 110 12/12/2016   CREATININE 0.61 12/12/2016   BUN 12 12/12/2016   CO2 21 (L) 12/12/2016   TSH 4.635 (H) 12/12/2016   INR 1.02 12/11/2016   HGBA1C 5.1 12/11/2016    Kerin Perna III, MD 12/12/2016 7:28 AM

## 2016-12-12 NOTE — Progress Notes (Signed)
Progress Note  Patient Name: Monica Lynch Date of Encounter: 12/12/2016  Primary Cardiologist: Bing Matter  Subjective   Anxious for CABG no chest pain   Inpatient Medications    Scheduled Meds: . [MAR Hold] aspirin EC  81 mg Oral Daily  . [MAR Hold] atorvastatin  80 mg Oral q1800  . heparin-papaverine-plasmalyte irrigation   Irrigation To OR  . [MAR Hold] Influenza vac split quadrivalent PF  0.5 mL Intramuscular Tomorrow-1000  . magnesium sulfate  40 mEq Other To OR  . metoprolol tartrate  12.5 mg Oral Once  . [MAR Hold] metoprolol tartrate  25 mg Oral BID  . potassium chloride  80 mEq Other To OR  . [MAR Hold] sodium chloride flush  3 mL Intravenous Q12H  . tranexamic acid  15 mg/kg Intravenous To OR  . tranexamic acid  2 mg/kg Intracatheter To OR   Continuous Infusions: . [MAR Hold] sodium chloride    . cefUROXime (ZINACEF)  IV    . cefUROXime (ZINACEF)  IV    . [MAR Hold] ciprofloxacin Stopped (12/11/16 2124)  . dexmedetomidine    . DOPamine    . epinephrine    . heparin 30,000 units/NS 1000 mL solution for CELLSAVER    . heparin Stopped (12/12/16 0606)  . insulin (NOVOLIN-R) infusion    . nitroGLYCERIN    . phenylephrine /281mL NS (0.08mg /ml) infusion    . tranexamic acid (CYKLOKAPRON) infusion ([facility])    . vancomycin     PRN Meds: [MAR Hold] sodium chloride, [MAR Hold] acetaminophen, ALPRAZolam, [MAR Hold] nitroGLYCERIN, [MAR Hold] ondansetron (ZOFRAN) IV, [MAR Hold] sodium chloride flush, temazepam   Vital Signs    Vitals:   12/11/16 2100 12/11/16 2142 12/11/16 2347 12/12/16 0455  BP: 128/66 128/66 140/76 121/69  Pulse: (!) 59 (!) 59 62 (!) 51  Resp: Temp: 98.5 F (36.9 C)  98.5 F (36.9 C) 98.3 F (36.8 C)  TempSrc: Oral  Oral Oral  SpO2: 95%  100% 99%  Weight:      Height:        Intake/Output Summary (Last 24 hours) at 12/12/16 0722 Last data filed at 12/12/16 0100  Gross per 24 hour  Intake           1010.6 ml  Output               600 ml  Net            410.6 ml   Filed Weights   12/09/16 0500 12/10/16 0413 12/11/16 0532  Weight: 173 lb (78.5 kg) 171 lb 8.3 oz (77.8 kg) 171 lb 4.8 oz (77.7 kg)    Telemetry    NSR no arrhythmia  - Personally Reviewed  ECG    NSR  Normal ECG - Personally Reviewed  Physical Exam  No acute distress GEN: No acute distress.   Neck: No JVD Cardiac: RRR, no murmurs, rubs, or gallops.  Respiratory: Clear to auscultation bilaterally. GI: Soft, nontender, non-distended  MS: No edema; No deformity. Neuro:  Nonfocal  Psych: Normal affect  Right radial A   Labs    Chemistry  Recent Labs Lab 12/08/16 1230 12/09/16 0537 12/11/16 1411 12/12/16 0506  NA 139 139 139 140  K 3.9 3.5 3.4* 3.8  CL 108 106 108 110  CO2 21*  GLUCOSE 85 95 118* 97  BUN CREATININE 0.61 0.59 0.65 0.61  CALCIUM 9.3 8.8* 9.0 9.2  PROT 7.3  --  6.8  --   ALBUMIN 4.2  --  3.8  --   AST 18  --  28  --   ALT 15  --  22  --   ALKPHOS 52  --  45  --   BILITOT 0.7  --  0.6  --   GFRNONAA >60 >60 >60 >60  GFRAA >60 >60 >60 >60  ANIONGAP Hematology  Recent Labs Lab 12/10/16 0458 12/11/16 0401 12/12/16 0506  WBC 6.2 6.7 6.9  RBC 4.37 4.55 4.43  HGB 13.4 13.8 13.8  HCT 40.4 41.3 40.5  MCV 92.4 90.8 91.4  MCH 30.7 30.3 31.2  MCHC 33.2 33.4 34.1  RDW 12.7 12.4 12.5  PLT 304 278 274    Cardiac Enzymes  Recent Labs Lab 12/08/16 1230  TROPONINI <0.03   No results for input(s): TROPIPOC in the last 168 hours.   BNPNo results for input(s): BNP, PROBNP in the last 168 hours.   DDimer No results for input(s): DDIMER in the last 168 hours.   Radiology    Dg Chest 2 View  Result Date: 12/11/2016 CLINICAL DATA:  Coronary bypass surgery in the morning. History of asthma and hypertension. Nonsmoker. EXAM: CHEST  2 VIEW COMPARISON:  12/08/2016 FINDINGS: Heart size and pulmonary vascularity are normal. Slight fibrosis or linear atelectasis in the lung  bases. No airspace disease or consolidation. No blunting of costophrenic angles. No pneumothorax. Mediastinal contours appear intact. Degenerative changes in the thoracic spine. IMPRESSION: No active cardiopulmonary disease. Electronically Signed   By: Burman Nieves M.D.   On: 12/11/2016 21:16    Cardiac Studies   Myovue 9/28 marked inferior wall ischemia  Patient Profile     56 y.o. female HTN and new onset chest pain with markedly positive myovue inferior wall ischemia for cath 12/11/16  Assessment & Plan    1) HTN;  Well controlled.  Continue current medications and low sodium Dash type diet.   2) Cholesterol on statin 3) CAD:  Reviewed cath films severe multivessel disease with total circumflex and collaterals right to left. For CABG this am with PVT PFT;s fine and labs ok   For questions or updates, please contact [facility] Please consult [email or web address] for contact info under Cardiology/STEMI.      Signed, Charlton Haws, MD  12/12/2016, 7:22 AM

## 2016-12-12 NOTE — Anesthesia Procedure Notes (Addendum)
Central Venous Catheter Insertion Performed by: Murvin Natal, anesthesiologist Start/End10/04/2016 6:45 AM, 12/12/2016 7:00 AM Patient location: Pre-op. Preanesthetic checklist: patient identified, IV checked, site marked, risks and benefits discussed, surgical consent, monitors and equipment checked, pre-op evaluation, timeout performed and anesthesia consent Position: Trendelenburg Lidocaine 1% used for infiltration and patient sedated Hand hygiene performed  and maximum sterile barriers used  Catheter size: 9 Fr Total catheter length 11. PA cath was placed.MAC introducer Swan type:thermodilution PA Cath depth:39 Procedure performed using ultrasound guided technique. Ultrasound Notes:anatomy identified, needle tip was noted to be adjacent to the nerve/plexus identified, no ultrasound evidence of intravascular and/or intraneural injection and image(s) printed for medical record Attempts: 1 Following insertion, line sutured, dressing applied and Biopatch. Post procedure assessment: blood return through all ports, free fluid flow and no air  Patient tolerated the procedure well with no immediate complications.

## 2016-12-12 NOTE — Brief Op Note (Addendum)
12/08/2016 - 12/12/2016  12:02 PM  PATIENT:  Monica Lynch  56 y.o. female  PRE-OPERATIVE DIAGNOSIS:  CAD  POST-OPERATIVE DIAGNOSIS:  CAD  PROCEDURE: TRANSESOPHAGEAL ECHOCARDIOGRAM (TEE), MEDIAN STERNOTOMY for CORONARY ARTERY BYPASS GRAFTING (CABG) x4 (LIMA to LAD, SVG to DIAGONAL, SVG to OM, SVG to PDA)  using left internal mammary artery and right leg greater saphenous vein harvested endoscopically   SURGEON:  Surgeon(s) and Role:    Kerin Perna, MD - Primary  PHYSICIAN ASSISTANT: Doree Fudge PA-C  ANESTHESIA:   general  EBL: 450 cc   Total I/O In: 1000 [I.V.:1000] Out: 625 [Urine:625]  DRAINS: Chest tubes placed in the mediastinal and pleural spaces   COUNTS:  YES  TOURNIQUET:  * No tourniquets in log *  DICTATION: .Dragon Dictation  PLAN OF CARE: Admit to inpatient   PATIENT DISPOSITION:  ICU - intubated and hemodynamically stable.   Delay start of Pharmacological VTE agent (>24hrs) due to surgical blood loss or risk of bleeding: yes  BASELINE WEIGHT: 78 kg

## 2016-12-12 NOTE — Transfer of Care (Signed)
Immediate Anesthesia Transfer of Care Note  Patient: Monica Lynch  Procedure(s) Performed: CORONARY ARTERY BYPASS GRAFTING (CABG) x four , using left internal mammary artery and right leg greater saphenous vein harvested endoscopically (N/A Chest) TRANSESOPHAGEAL ECHOCARDIOGRAM (TEE) (N/A )  Patient Location: ICU  Anesthesia Type:General  Level of Consciousness: sedated and unresponsive  Airway & Oxygen Therapy: Patient remains intubated per anesthesia plan and Patient placed on Ventilator (see vital sign flow sheet for setting)  Post-op Assessment: Report given to RN and Post -op Vital signs reviewed and stable  Post vital signs: Reviewed and stable  Last Vitals:  Vitals:   12/11/16 2347 12/12/16 0455  BP: 140/76 121/69  Pulse: 62 (!) 51  Resp: 18 18  Temp: 36.9 C 36.8 C  SpO2: 100% 99%    Last Pain:  Vitals:   12/12/16 0455  TempSrc: Oral  PainSc:       Patients Stated Pain Goal: 0 (12/11/16 1025)  Complications: No apparent anesthesia complications

## 2016-12-12 NOTE — Progress Notes (Signed)
      [address]       [place] [id number]             [phone]      S/p CABG x 4  Extubated  BP 104/63   Pulse 62   Temp 98.8 F (37.1 C)   Resp (!) 6   Ht  (1.575 m)   Wt 171 lb 4.8 oz (77.7 kg)   SpO2 100%   BMI 31.33 kg/m    Intake/Output Summary (Last 24 hours) at 12/12/16 1854 Last data filed at 12/12/16 1800  Gross per 24 hour  Intake          4472.77 ml  Output             3225 ml  Net          1247.77 ml   Cardiac index > 2  Minimal CT output  Doing well early postop  Viviann Spare C. Dorris Fetch, MD [facility] [phone]

## 2016-12-12 NOTE — Plan of Care (Signed)
Problem: Pain Managment: Goal: General experience of comfort will improve Outcome: Completed/Met Date Met: 12/12/16 Patient was asked of any type of pain before surgery to provide comfort measures.

## 2016-12-13 ENCOUNTER — Encounter (HOSPITAL_COMMUNITY): Payer: Self-pay | Admitting: Cardiothoracic Surgery

## 2016-12-13 ENCOUNTER — Other Ambulatory Visit: Payer: Self-pay

## 2016-12-13 ENCOUNTER — Inpatient Hospital Stay (HOSPITAL_COMMUNITY): Payer: Self-pay

## 2016-12-13 DIAGNOSIS — I200 Unstable angina: Secondary | ICD-10-CM

## 2016-12-13 LAB — POCT I-STAT, CHEM 8
BUN: 9 mg/dL (ref 6–20)
Calcium, Ion: 1.19 mmol/L (ref 1.15–1.40)
Chloride: 99 mmol/L — ABNORMAL LOW (ref 101–111)
Creatinine, Ser: 0.6 mg/dL (ref 0.44–1.00)
Glucose, Bld: 154 mg/dL — ABNORMAL HIGH (ref 65–99)
HEMATOCRIT: 31 % — AB (ref 36.0–46.0)
HEMOGLOBIN: 10.5 g/dL — AB (ref 12.0–15.0)
POTASSIUM: 4.4 mmol/L (ref 3.5–5.1)
SODIUM: 135 mmol/L (ref 135–145)
TCO2: 26 mmol/L (ref 22–32)

## 2016-12-13 LAB — CBC
HCT: 29.2 % — ABNORMAL LOW (ref 36.0–46.0)
HCT: 32.3 % — ABNORMAL LOW (ref 36.0–46.0)
HEMOGLOBIN: 10.1 g/dL — AB (ref 12.0–15.0)
HEMOGLOBIN: 10.6 g/dL — AB (ref 12.0–15.0)
MCH: 31.5 pg (ref 26.0–34.0)
MCH: 30.4 pg (ref 26.0–34.0)
MCHC: 34.6 g/dL (ref 30.0–36.0)
MCHC: 32.8 g/dL (ref 30.0–36.0)
MCV: 91 fL (ref 78.0–100.0)
MCV: 92.6 fL (ref 78.0–100.0)
PLATELETS: 182 10*3/uL (ref 150–400)
Platelets: 195 10*3/uL (ref 150–400)
RBC: 3.21 MIL/uL — AB (ref 3.87–5.11)
RBC: 3.49 MIL/uL — AB (ref 3.87–5.11)
RDW: 12.6 % (ref 11.5–15.5)
RDW: 12.7 % (ref 11.5–15.5)
WBC: 12 10*3/uL — ABNORMAL HIGH (ref 4.0–10.5)
WBC: 15.8 10*3/uL — ABNORMAL HIGH (ref 4.0–10.5)

## 2016-12-13 LAB — MAGNESIUM
MAGNESIUM: 2.1 mg/dL (ref 1.7–2.4)
Magnesium: 2 mg/dL (ref 1.7–2.4)

## 2016-12-13 LAB — BASIC METABOLIC PANEL WITH GFR
Anion gap: 9 (ref 5–15)
BUN: 7 mg/dL (ref 6–20)
CO2: 23 mmol/L (ref 22–32)
Calcium: 8.3 mg/dL — ABNORMAL LOW (ref 8.9–10.3)
Chloride: 104 mmol/L (ref 101–111)
Creatinine, Ser: 0.58 mg/dL (ref 0.44–1.00)
GFR calc Af Amer: >60 mL/min
GFR calc non Af Amer: >60 mL/min
Glucose, Bld: 119 mg/dL — ABNORMAL HIGH (ref 65–99)
Potassium: 4.4 mmol/L (ref 3.5–5.1)
Sodium: 136 mmol/L (ref 135–145)

## 2016-12-13 LAB — GLUCOSE, CAPILLARY
GLUCOSE-CAPILLARY: 98 mg/dL (ref 65–99)
GLUCOSE-CAPILLARY: 105 mg/dL — AB (ref 65–99)
GLUCOSE-CAPILLARY: 149 mg/dL — AB (ref 65–99)
GLUCOSE-CAPILLARY: 152 mg/dL — AB (ref 65–99)
Glucose-Capillary: 67 mg/dL (ref 65–99)
Glucose-Capillary: 133 mg/dL — ABNORMAL HIGH (ref 65–99)
Glucose-Capillary: 151 mg/dL — ABNORMAL HIGH (ref 65–99)

## 2016-12-13 LAB — CREATININE, SERUM
Creatinine, Ser: 0.71 mg/dL (ref 0.44–1.00)
GFR calc Af Amer: >60 mL/min (ref >60)
GFR calc non Af Amer: >60 mL/min (ref >60)

## 2016-12-13 MED ORDER — INSULIN ASPART 100 UNIT/ML ~~LOC~~ SOLN
0.0000 [IU] | SUBCUTANEOUS | Status: DC
  Administered 2016-12-13: 2 [IU] via SUBCUTANEOUS
  Administered 2016-12-13: 0 [IU] via SUBCUTANEOUS
  Administered 2016-12-13 (×2): 2 [IU] via SUBCUTANEOUS

## 2016-12-13 MED ORDER — FUROSEMIDE 10 MG/ML IJ SOLN
20.0000 mg | Freq: Two times a day (BID) | INTRAMUSCULAR | Status: DC
  Administered 2016-12-13 – 2016-12-14 (×3): 20 mg via INTRAVENOUS
  Filled 2016-12-13 (×3): qty 2

## 2016-12-13 MED ORDER — VANCOMYCIN HCL IN DEXTROSE 1-5 GM/200ML-% IV SOLN
1000.0000 mg | Freq: Once | INTRAVENOUS | Status: AC
  Administered 2016-12-13: 1000 mg via INTRAVENOUS
  Filled 2016-12-13: qty 200

## 2016-12-13 MED ORDER — MIDAZOLAM HCL 2 MG/2ML IJ SOLN: 1.0000 mg | Freq: Four times a day (QID) | INTRAMUSCULAR | Status: DC | PRN

## 2016-12-13 MED FILL — Electrolyte-R (PH 7.4) Solution: INTRAVENOUS | Qty: 3000 | Status: AC

## 2016-12-13 MED FILL — Sodium Chloride IV Soln 0.9%: INTRAVENOUS | Qty: 3000 | Status: AC

## 2016-12-13 MED FILL — Lidocaine HCl IV Inj 20 MG/ML: INTRAVENOUS | Qty: 5 | Status: AC

## 2016-12-13 MED FILL — Sodium Bicarbonate IV Soln 8.4%: INTRAVENOUS | Qty: 50 | Status: AC

## 2016-12-13 MED FILL — Heparin Sodium (Porcine) Inj 1000 Unit/ML: INTRAMUSCULAR | Qty: 30 | Status: AC

## 2016-12-13 MED FILL — Mannitol IV Soln 20%: INTRAVENOUS | Qty: 500 | Status: AC

## 2016-12-13 NOTE — Op Note (Signed)
NAMEMarland Kitchen  PEARLENA, OW            ACCOUNT NO.:  [id number]  MEDICAL RECORD NO.:  [id number]  LOCATION:                                 FACILITY:  PHYSICIAN:  Kerin Perna, M.D.       DATE OF BIRTH:  DATE OF PROCEDURE:  12/12/2016 DATE OF DISCHARGE:                              OPERATIVE REPORT   OPERATIONS: 1. Coronary artery bypass grafting x4 (left internal mammary artery to     left anterior descending, saphenous vein graft to posterior     descending, saphenous vein graft to diagonal, saphenous vein graft     to OM-1). 2. Endoscopic harvest of right leg greater saphenous vein.  SURGEON:  Kerin Perna, MD.  ASSISTANT:  Doree Fudge, PA-C.  ANESTHESIA:  General by Dr. Val Eagle.  PREOPERATIVE DIAGNOSES:  Unstable angina, abnormal stress test, severe 3- vessel coronary artery disease.  POSTOPERATIVE DIAGNOSES:  Unstable angina, abnormal stress test, severe 3-vessel coronary artery disease.  CLINICAL NOTE:  The patient is a 56 year old obese, borderline diabetic, nonsmoker, with symptoms of exertional angina and shortness of breath of increasing severity and frequency over the past 2 weeks, culminating in chest pain at rest.  A stress test was performed which was significantly positive, and the patient was directly admitted to the hospital and placed on heparin.  She underwent cardiac catheterization the day prior to surgery, which demonstrated severe 3-vessel coronary artery disease. LV systolic function was fairly well preserved.  Echocardiogram showed no valvular abnormalities and her chest x-ray was clear.  She was recommended for surgical coronary revascularization by her cardiologist. I saw the patient in consultation and agreed with that recommendation. I discussed the indications and expected benefits of coronary artery bypass grafting for treatment of the patient's coronary artery disease. I reviewed the details of surgery including the use of  general anesthesia and cardiopulmonary bypass, the location of the surgical incisions, and the expected postoperative hospital recovery.  I discussed with the patient risks to her of coronary artery bypass surgery including risks of stroke, bleeding, blood transfusion requirement, MI, postoperative pulmonary problems including pleural effusion, postoperative arrhythmias, postoperative infection, postoperative organ failure, and death.  After reviewing these issues, she demonstrated her understanding and agreed to proceed with surgery under what I felt was an informed consent.  OPERATIVE FINDINGS: 1. Heavily diseased small targets with borderline targets in the right     coronary and circumflex circulations. 2. Adequate targets with good flow and a small 1.5-mm mammary artery. 3. No blood product transfusion required for the surgery.  DESCRIPTION OF PROCEDURE:  The patient was brought to the operating room and placed supine on the operating table.  General anesthesia was induced under invasive hemodynamic monitoring.  A transesophageal echo probe was placed by the Anesthesia team.  The chest, abdomen, and legs were prepped with Betadine and draped as a sterile field.  A sternal incision was made.  The saphenous vein was harvested endoscopically from the right leg.  The left internal mammary artery was harvested as a pedicle graft from its origin at the subclavian vessels.  It was a 1.5- mm vessel, but with excellent flow.  The sternal retractor was placed  using the deep blades because of the patient's obese body habitus.  The pericardium was opened and suspended.  Pursestrings were placed in the ascending aorta and right atrium, and after the vein was harvested, heparin was administered and the ACT was documented as being therapeutic.  The patient was cannulated and placed on cardiopulmonary bypass and the coronaries were identified for grafting.  The targets were poor in the right  coronary artery and circumflex distribution, but graftable.  The mammary artery and vein grafts were prepared for the distal anastomoses, and cardioplegia cannulas were placed, both antegrade aortic and retrograde coronary sinus catheters.  There was good cardioplegic arrest and septal temperature dropped over less than 14 degrees after application of a cross-clamp.  Cardioplegia was delivered every 20 minutes while the cross-clamp was in place.  There was good hypothermic cardioplegic arrest with topical ice and cold blood cardioplegia.  The distal coronary anastomoses were performed.  The first distal anastomosis was to the posterior descending branch of the right coronary artery.  This was a 1-mm vessel.  It had a proximal 90% stenosis.  A reversed saphenous vein was sewn end-to-side with running 7-0 Prolene with good flow through the graft.  Cardioplegia was redosed.  The second distal anastomosis was to the OM-1 branch of the circumflex. This had a proximal 90% stenosis.  A reversed saphenous vein was sewn end-to-side with running 7-0 Prolene to this 1.2-mm vessel and there was good flow through the graft.  The third distal anastomosis was to the large diagonal branch to the LAD.  There was an ostial 90% stenosis.  There was a 1.5-mm vessel.  A reversed saphenous vein was sewn end-to-side with running 7-0 Prolene with good flow through the graft.  Cardioplegia was redosed.  The fourth distal anastomosis was to the mid LAD.  The LAD was small, 1.7 mm, and intramyocardial more proximally.  The left IMA pedicle was brought through an opening, and the pericardium was brought down onto the LAD and sewn end-to-side with running 8-0 Prolene.  There was good flow through the anastomosis after briefly releasing the pedicle bulldog on the mammary artery.  The bulldog was reapplied, and the pedicle was secured to the epicardium with 6-0 Prolene.  Cardioplegia was redosed.  While the  crossclamp was still in place, 3 proximal vein anastomoses were performed on the ascending aorta with a 4.0-mm punch and running 6- 0 Prolene.  Prior to tying down the final proximal anastomosis, air was vented from the coronaries with a dose of retrograde warm blood cardioplegia.  The heart was cardioverted back to regular rhythm.  The vein grafts were de-aired and opened and each had good flow, and hemostasis was documented at the proximal and distal anastomoses.  The patient was rewarmed and reperfused and temporary pacing wires were applied.  The lungs were expanded and the ventilator was resumed.  The patient was weaned off cardiopulmonary bypass on renal dose dopamine with good cardiac function, normal cardiac output, and normal LV function on echo.  Protamine was administered without adverse reaction. The cannulas were removed.  The mediastinum was irrigated with warm saline.  The superior pericardial fat was closed over the aorta and vein grafts.  Anterior mediastinal and left pleural chest tubes were placed and brought out through separate incisions.  The patient remained stable.  The sternum was closed with interrupted steel wire.  The pectoralis fascia was closed with a running #1 Vicryl.  The subcutaneous and skin layers were closed in  running Vicryl and sterile dressings were applied. Total cardiopulmonary bypass time was 130 minutes.     Kerin Perna, M.D.   ______________________________ Kerin Perna, M.D.    PV/MEDQ  D:  12/12/2016  T:  12/13/2016  Job:  [id number]  cc:   Yvonne Kendall, M.D.

## 2016-12-13 NOTE — Progress Notes (Signed)
CRITICAL VALUE ALERT  Critical Value:  CBG  Date & Time Notied:  10/3 @ [id number]  Provider Notified: will notify in AM on rounds.  Orders Received/Actions taken: Patient given 8 oz Orange Juice with no symptoms. Recheck CBG 105.   Horton Chin, RN

## 2016-12-13 NOTE — Care Management Note (Addendum)
Case Management Note  Patient Details  Name: Wrigley Winborne MRN: [id number] Date of Birth: [birth year]  Subjective/Objective:    Form home with spouse and daughter, who is able to assist patient after discharge, post op CABG, cont on milrinone and diuresis.  Patient has no insurance, she states she would like to stay in the [facility] system, so she would like to get a follow up apt at one of our clinics,  [facility] has no apt available til after November 1, [facility] states to call back on Oct 16  To schedule a follow up.  Patient states she uses all the generic medications she can get.  NCM gave patient brochure for [facility] and informed her to call on Oct 16 to schedule hospital follow up apt.                 Action/Plan: NCM will follow for dc needs.   Expected Discharge Date:                  Expected Discharge Plan:     In-House Referral:     Discharge planning Services  CM Consult  Post Acute Care Choice:    Choice offered to:     DME Arranged:    DME Agency:     HH Arranged:    HH Agency:     Status of Service:  In process, will continue to follow  If discussed at Long Length of Stay Meetings, dates discussed:    Additional Comments:  Leone Haven, RN 12/13/2016, 10:22 AM

## 2016-12-13 NOTE — Progress Notes (Signed)
Progress Note  Patient Name: Monica Lynch Date of Encounter: 12/13/2016  Primary Cardiologist: Agustin Cree  Subjective   Typical post op pain   Inpatient Medications    Scheduled Meds: . acetaminophen  1,000 mg Oral Q6H   Or  . acetaminophen (TYLENOL) oral liquid 160 mg/5 mL  1,000 mg Per Tube Q6H  . aspirin EC  325 mg Oral Daily   Or  . aspirin  324 mg Per Tube Daily  . atorvastatin  80 mg Oral q1800  . bisacodyl  10 mg Oral Daily   Or  . bisacodyl  10 mg Rectal Daily  . chlorhexidine gluconate (MEDLINE KIT)  15 mL Mouth Rinse BID  . docusate sodium  200 mg Oral Daily  . furosemide  20 mg Intravenous BID  . insulin aspart  0-24 Units Subcutaneous Q4H  . insulin aspart  0-24 Units Subcutaneous Q4H  . mouth rinse  15 mL Mouth Rinse BID  . metoCLOPramide (REGLAN) injection  10 mg Intravenous Q6H  . metoprolol tartrate  12.5 mg Oral BID   Or  . metoprolol tartrate  12.5 mg Per Tube BID  . pantoprazole  40 mg Oral Daily  . sodium chloride flush  3 mL Intravenous Q12H   Continuous Infusions: . sodium chloride 20 mL/hr at 12/13/16 0400  . sodium chloride    . sodium chloride 20 mL/hr at 12/12/16 2000  . albumin human    . cefUROXime (ZINACEF)  IV 1.5 g (12/13/16 0533)  . lactated ringers 20 mL/hr at 12/13/16 0200  . milrinone    . nitroGLYCERIN Stopped (12/12/16 1430)  . phenylephrine (NEO-SYNEPHRINE) Adult infusion Stopped (12/12/16 2100)  . vancomycin     PRN Meds: sodium chloride, albumin human, metoprolol tartrate, midazolam, morphine injection, ondansetron (ZOFRAN) IV, oxyCODONE, sodium chloride flush, traMADol   Vital Signs    Vitals:   12/13/16 0600 12/13/16 0700 12/13/16 0800 12/13/16 0819  BP: 110/64 108/68 115/70   Pulse: 76 83 78 80  Resp: '14 10 12 14  '$ Temp: 99.7 F (37.6 C) 99.5 F (37.5 C) 99.1 F (37.3 C) 99.1 F (37.3 C)  TempSrc: Core (Comment) Core (Comment)    SpO2: 98% 97% 98% 97%  Weight:      Height:        Intake/Output  Summary (Last 24 hours) at 12/13/16 0855 Last data filed at 12/13/16 0800  Gross per 24 hour  Intake          5688.48 ml  Output             4480 ml  Net          1208.48 ml   Filed Weights   12/10/16 0413 12/11/16 0532 12/13/16 0401  Weight: 171 lb 8.3 oz (77.8 kg) 171 lb 4.8 oz (77.7 kg) 179 lb 10.8 oz (81.5 kg)    Telemetry    NSR no arrhythmia  - Personally Reviewed  ECG    NSR  Normal ECG - Personally Reviewed  Physical Exam  No acute distress GEN: No acute distress.   Neck: No JVD Cardiac Rub post sternotomy  Respiratory: Clear to auscultation bilaterally. GI: Soft, nontender, non-distended  MS: No edema; No deformity. Neuro:  Nonfocal  Psych: Normal affect  Right radial A   Labs    Chemistry  Recent Labs Lab 12/08/16 1230  12/11/16 1411 12/12/16 0506  12/12/16 1303 12/12/16 1420 12/12/16 2003 12/12/16 2012 12/13/16 0308  NA 139  < > 139 140  < >  141 142 139  --  136  K 3.9  < > 3.4* 3.8  < > 3.6 3.5 4.6  --  4.4  CL 108  < > 108 110  < > 106  --  105  --  104  CO2 23  < > 22 21*  --   --   --   --   --  23  GLUCOSE 85  < > 118* 97  < > 131* 112* 144*  --  119*  BUN 10  < > 12 12  < > 7  --  7  --  7  CREATININE 0.61  < > 0.65 0.61  < > 0.50  --  0.40* 0.51 0.58  CALCIUM 9.3  < > 9.0 9.2  --   --   --   --   --  8.3*  PROT 7.3  --  6.8  --   --   --   --   --   --   --   ALBUMIN 4.2  --  3.8  --   --   --   --   --   --   --   AST 18  --  28  --   --   --   --   --   --   --   ALT 15  --  22  --   --   --   --   --   --   --   ALKPHOS 52  --  45  --   --   --   --   --   --   --   BILITOT 0.7  --  0.6  --   --   --   --   --   --   --   GFRNONAA >60  < > >60 >60  --   --   --   --  >60 >60  GFRAA >60  < > >60 >60  --   --   --   --  >60 >60  ANIONGAP 8  < > 9 9  --   --   --   --   --  9  < > = values in this interval not displayed.   Hematology  Recent Labs Lab 12/12/16 1433 12/12/16 2003 12/12/16 2012 12/13/16 0308  WBC 17.9*  --  15.9*  12.0*  RBC 3.83*  --  3.54* 3.21*  HGB 11.5* 10.2* 10.6* 10.1*  HCT 34.5* 30.0* 32.0* 29.2*  MCV 90.1  --  90.4 91.0  MCH 30.0  --  29.9 31.5  MCHC 33.3  --  33.1 34.6  RDW 12.3  --  12.2 12.6  PLT 182  --  188 182    Cardiac Enzymes  Recent Labs Lab 12/08/16 1230  TROPONINI <0.03   No results for input(s): TROPIPOC in the last 168 hours.   BNPNo results for input(s): BNP, PROBNP in the last 168 hours.   DDimer No results for input(s): DDIMER in the last 168 hours.   Radiology    Dg Chest 2 View  Result Date: 12/11/2016 CLINICAL DATA:  Coronary bypass surgery in the morning. History of asthma and hypertension. Nonsmoker. EXAM: CHEST  2 VIEW COMPARISON:  12/08/2016 FINDINGS: Heart size and pulmonary vascularity are normal. Slight fibrosis or linear atelectasis in the lung bases. No airspace disease or consolidation. No blunting of costophrenic angles. No pneumothorax. Mediastinal contours appear  intact. Degenerative changes in the thoracic spine. IMPRESSION: No active cardiopulmonary disease. Electronically Signed   By: Lucienne Capers M.D.   On: 12/11/2016 21:16   Dg Chest Port 1 View  Result Date: 12/13/2016 CLINICAL DATA:  Status post CABG on December 12, 2016 EXAM: PORTABLE CHEST 1 VIEW COMPARISON:  Portable chest x-ray of December 12, 2016. FINDINGS: The trachea and esophagus have been extubated. The lungs are reasonably well inflated. The mediastinal drain and left chest tube are in stable position. There is no large pleural effusion and no pneumothorax. There is minimal left lower lobe atelectasis. The cardiac silhouette is mildly enlarged. The pulmonary vascularity is not engorged. The Swan-Ganz catheter tip projects over the proximal right main pulmonary artery. The sternal wires are intact. IMPRESSION: Interval extubation of the trachea. Reasonable inflation of both lungs. Minimal left lower lobe atelectasis. No large pleural effusion and no pneumothorax. Mild cardiomegaly  without pulmonary vascular congestion. Electronically Signed   By: David  Martinique M.D.   On: 12/13/2016 08:03   Dg Chest Port 1 View  Result Date: 12/12/2016 CLINICAL DATA:  Status post CABG. EXAM: PORTABLE CHEST 1 VIEW COMPARISON:  Chest x-ray from yesterday. FINDINGS: Postsurgical changes related to interval CABG. Intact median sternotomy wires. Endotracheal tube in place with the tip approximately 3.2 cm above the level of the carina. Enteric tube with the tip in the stomach and distal side port near the gastroesophageal junction. Right internal jugular Swan-Ganz catheter with the tip projecting over the main pulmonary outflow tract. Mediastinal and left chest tubes are noted. The cardiomediastinal silhouette is normal in size. Normal pulmonary vascularity. No focal consolidation, pleural effusion, or pneumothorax. No acute osseous abnormality. IMPRESSION: 1. Postsurgical changes related to interval CABG, with appropriate positioning of lines and tubes. The distal side port of the enteric tube is at the level of the gastroesophageal junction. Recommend advancement 3-4 cm. 2. No acute cardiopulmonary process. Electronically Signed   By: Titus Dubin M.D.   On: 12/12/2016 14:44    Cardiac Studies   Myovue 9/28 marked inferior wall ischemia  Patient Profile     56 y.o. female HTN and new onset chest pain with markedly positive myovue inferior wall ischemia for cath 12/11/16  Assessment & Plan    1) HTN;  Well controlled.  Continue current medications and low sodium Dash type diet.   2) Cholesterol on statin 3) CAD:  Reviewed cath films severe multivessel disease with total circumflex and collaterals right to left.Day one post CABG Hemodynamics and rhythm stable   Appreciate PVT;s timely operation Outpatient f/u Agustin Cree    For questions or updates, please contact [place] Please consult [email or web address] for contact info under Cardiology/STEMI.      Signed, Jenkins Rouge, MD    12/13/2016, 8:55 AM

## 2016-12-13 NOTE — Progress Notes (Signed)
Procedure(s) (LRB): CORONARY ARTERY BYPASS GRAFTING (CABG) x four , using left internal mammary artery and right leg greater saphenous vein harvested endoscopically (N/A) TRANSESOPHAGEAL ECHOCARDIOGRAM (TEE) (N/A) Subjective: doing well postop CABG for ACS nsr Objective: Vital signs in last 24 hours: Temp:  [96.6 F (35.9 C)-99.7 F (37.6 C)] 99.1 F (37.3 C) (10/03 0819) Pulse Rate:  [62-96] 80 (10/03 0819) Cardiac Rhythm: Normal sinus rhythm (10/03 0800) Resp:  [0-28] 14 (10/03 0819) BP: (90-148)/(52-82) 115/70 (10/03 0800) SpO2:  [97 %-100 %] 97 % (10/03 0819) Arterial Line BP: (96-133)/(48-72) 120/61 (10/03 0819) FiO2 (%):  [50 %] 50 % (10/02 1528) Weight:  [179 lb 10.8 oz (81.5 kg)] 179 lb 10.8 oz (81.5 kg) (10/03 0401)  Hemodynamic parameters for last 24 hours: PAP: (18-33)/(9-23) 27/12 CO:  [4.2 L/min-4.8 L/min] 4.3 L/min CI:  [2.4 L/min/m2-2.7 L/min/m2] 2.4 L/min/m2  Intake/Output from previous day: 10/02 0701 - 10/03 0700 In: 5635.6 [P.O.:400; I.V.:4095.6; Blood:190; IV Piggyback:950] Out: 4475 [Urine:3505; Blood:600; Chest Tube:370] Intake/Output this shift: Total I/O In: 52.9 [I.V.:52.9] Out: 130 [Urine:100; Chest Tube:30]       Exam    General- alert and comfortable   Lungs- clear without rales, wheezes   Cor- regular rate and rhythm, no murmur , gallop   Abdomen- soft, non-tender   Extremities - warm, non-tender, minimal edema   Neuro- oriented, appropriate, no focal weakness   Lab Results:  Recent Labs  12/12/16 2012 12/13/16 0308  WBC 15.9* 12.0*  HGB 10.6* 10.1*  HCT 32.0* 29.2*  PLT 188 182   BMET:  Recent Labs  12/12/16 0506  12/12/16 2003 12/12/16 2012 12/13/16 0308  NA 140  < > 139  --  136  K 3.8  < > 4.6  --  4.4  CL 110  < > 105  --  104  CO2 21*  --   --   --  23  GLUCOSE 97  < > 144*  --  119*  BUN 12  < > 7  --  7  CREATININE 0.61  < > 0.40* 0.51 0.58  CALCIUM 9.2  --   --   --  8.3*  < > = values in this interval not  displayed.  PT/INR:  Recent Labs  12/12/16 1433  LABPROT 16.4*  INR 1.33   ABG    Component Value Date/Time   PHART 7.333 (L) 12/12/2016 1758   HCO3 23.7 12/12/2016 1758   TCO2 22 12/12/2016 2003   ACIDBASEDEF 2.0 12/12/2016 1758   O2SAT 99.0 12/12/2016 1758   CBG (last 3)   Recent Labs  12/13/16 0308 12/13/16 0332 12/13/16 0730  GLUCAP 67 105* 133*    Assessment/Plan: S/P Procedure(s) (LRB): CORONARY ARTERY BYPASS GRAFTING (CABG) x four , using left internal mammary artery and right leg greater saphenous vein harvested endoscopically (N/A) TRANSESOPHAGEAL ECHOCARDIOGRAM (TEE) (N/Astart plavix with 81 asa prior to DC for ACS   LOS: 5 days  Diuresis Progression orders Start plavix/ 81 asa at DC for ACS  Monica Lynch 12/13/2016

## 2016-12-13 NOTE — Progress Notes (Signed)
Patient ID: Amneet Cendejas, female   DOB: Oct 15, 1960, 56 y.o.   MRN: [id number]  TCTS Evening Rounds:   Hemodynamically stable    Urine output good   Ambulated short distance only  CBC    Component Value Date/Time   WBC 15.8 (H) 12/13/2016 1602   RBC 3.49 (L) 12/13/2016 1602   HGB 10.5 (L) 12/13/2016 1610   HCT 31.0 (L) 12/13/2016 1610   PLT 195 12/13/2016 1602   MCV 92.6 12/13/2016 1602   MCH 30.4 12/13/2016 1602   MCHC 32.8 12/13/2016 1602   RDW 12.7 12/13/2016 1602   LYMPHSABS 2.0 12/08/2016 1230   MONOABS 0.3 12/08/2016 1230   EOSABS 0.1 12/08/2016 1230   BASOSABS 0.0 12/08/2016 1230     BMET    Component Value Date/Time   NA 135 12/13/2016 1610   K 4.4 12/13/2016 1610   CL 99 (L) 12/13/2016 1610   CO2 23 12/13/2016 0308   GLUCOSE 154 (H) 12/13/2016 1610   BUN 9 12/13/2016 1610   CREATININE 0.60 12/13/2016 1610   CALCIUM 8.3 (L) 12/13/2016 0308   GFRNONAA >60 12/13/2016 1602   GFRAA >60 12/13/2016 1602     A/P:  Stable postop course. Continue current plans

## 2016-12-14 ENCOUNTER — Inpatient Hospital Stay (HOSPITAL_COMMUNITY): Payer: Self-pay

## 2016-12-14 LAB — BASIC METABOLIC PANEL
Anion gap: 8 (ref 5–15)
BUN: 12 mg/dL (ref 6–20)
CO2: 27 mmol/L (ref 22–32)
Calcium: 8.5 mg/dL — ABNORMAL LOW (ref 8.9–10.3)
Chloride: 99 mmol/L — ABNORMAL LOW (ref 101–111)
Creatinine, Ser: 0.7 mg/dL (ref 0.44–1.00)
GFR calc Af Amer: >60 mL/min (ref >60)
GFR calc non Af Amer: >60 mL/min (ref >60)
Glucose, Bld: 111 mg/dL — ABNORMAL HIGH (ref 65–99)
Potassium: 4 mmol/L (ref 3.5–5.1)
Sodium: 134 mmol/L — ABNORMAL LOW (ref 135–145)

## 2016-12-14 LAB — CBC
HCT: 28.2 % — ABNORMAL LOW (ref 36.0–46.0)
Hemoglobin: 9.5 g/dL — ABNORMAL LOW (ref 12.0–15.0)
MCH: 31.3 pg (ref 26.0–34.0)
MCHC: 33.7 g/dL (ref 30.0–36.0)
MCV: 92.8 fL (ref 78.0–100.0)
Platelets: 178 10*3/uL (ref 150–400)
RBC: 3.04 MIL/uL — ABNORMAL LOW (ref 3.87–5.11)
RDW: 12.9 % (ref 11.5–15.5)
WBC: 14.7 10*3/uL — ABNORMAL HIGH (ref 4.0–10.5)

## 2016-12-14 LAB — TYPE AND SCREEN
ABO/RH(D): B POS
Antibody Screen: NEGATIVE
Unit division: 0
Unit division: 0

## 2016-12-14 LAB — BPAM RBC
Blood Product Expiration Date: 201810192359
Blood Product Expiration Date: 201810192359
Unit Type and Rh: 7300
Unit Type and Rh: 7300

## 2016-12-14 LAB — GLUCOSE, CAPILLARY
GLUCOSE-CAPILLARY: 113 mg/dL — AB (ref 65–99)
GLUCOSE-CAPILLARY: 116 mg/dL — AB (ref 65–99)
Glucose-Capillary: 110 mg/dL — ABNORMAL HIGH (ref 65–99)
Glucose-Capillary: 117 mg/dL — ABNORMAL HIGH (ref 65–99)
Glucose-Capillary: 141 mg/dL — ABNORMAL HIGH (ref 65–99)

## 2016-12-14 MED ORDER — MAGNESIUM HYDROXIDE 400 MG/5ML PO SUSP: 30.0000 mL | Freq: Every day | ORAL | Status: DC | PRN

## 2016-12-14 MED ORDER — SODIUM CHLORIDE 0.9% FLUSH
3.0000 mL | Freq: Two times a day (BID) | INTRAVENOUS | Status: DC
  Administered 2016-12-15 (×2): 3 mL via INTRAVENOUS

## 2016-12-14 MED ORDER — FUROSEMIDE 10 MG/ML IJ SOLN
40.0000 mg | Freq: Every day | INTRAMUSCULAR | Status: DC
  Administered 2016-12-15: 40 mg via INTRAVENOUS
  Filled 2016-12-14 (×2): qty 4

## 2016-12-14 MED ORDER — SODIUM CHLORIDE 0.9 % IV SOLN: 250.0000 mL | INTRAVENOUS | Status: DC | PRN

## 2016-12-14 MED ORDER — INSULIN ASPART 100 UNIT/ML ~~LOC~~ SOLN: 0.0000 [IU] | Freq: Three times a day (TID) | SUBCUTANEOUS | Status: DC

## 2016-12-14 MED ORDER — SODIUM CHLORIDE 0.9% FLUSH: 3.0000 mL | INTRAVENOUS | Status: DC | PRN

## 2016-12-14 MED ORDER — MOVING RIGHT ALONG BOOK
Freq: Once | Status: DC
  Filled 2016-12-14: qty 1

## 2016-12-14 MED ORDER — INSULIN ASPART 100 UNIT/ML ~~LOC~~ SOLN: 0.0000 [IU] | Freq: Every day | SUBCUTANEOUS | Status: DC

## 2016-12-14 MED ORDER — POTASSIUM CHLORIDE CRYS ER 20 MEQ PO TBCR
20.0000 meq | EXTENDED_RELEASE_TABLET | Freq: Two times a day (BID) | ORAL | Status: DC
  Administered 2016-12-14 – 2016-12-17 (×7): 20 meq via ORAL
  Filled 2016-12-14 (×7): qty 1

## 2016-12-14 NOTE — Progress Notes (Signed)
Patient arrived to 4E room 27 from 2H.  Telemetry monitor applied and CCMD notified.  Patient oriented to unit and room to include call light and phone.  Will continue to monitor.

## 2016-12-14 NOTE — Progress Notes (Signed)
2 Days Post-Op Procedure(s) (LRB): CORONARY ARTERY BYPASS GRAFTING (CABG) x four , using left internal mammary artery and right leg greater saphenous vein harvested endoscopically (N/A) TRANSESOPHAGEAL ECHOCARDIOGRAM (TEE) (N/A) Subjective: Stronger- walked in hall nsr cxr clear  Objective: Vital signs in last 24 hours: Temp:  [97.6 F (36.4 C)-99.1 F (37.3 C)] 98.6 F (37 C) (10/04 0700) Pulse Rate:  [63-88] 65 (10/04 0700) Cardiac Rhythm: Normal sinus rhythm (10/04 0800) Resp:  [11-26] 13 (10/04 0700) BP: (104-137)/(66-83) 122/72 (10/04 0700) SpO2:  [91 %-100 %] 92 % (10/04 0700) Arterial Line BP: (116-124)/(63-65) 116/63 (10/03 1000) Weight:  [177 lb 14.6 oz (80.7 kg)] 177 lb 14.6 oz (80.7 kg) (10/04 0500)  Hemodynamic parameters for last 24 hours: PAP: (27-28)/(13-15) 28/15  Intake/Output from previous day: 10/03 0701 - 10/04 0700 In: 698.6 [P.O.:440; I.V.:158.6; IV Piggyback:100] Out: 1400 [Urine:1290; Chest Tube:110] Intake/Output this shift: No intake/output data recorded.       Exam    General- alert and comfortable   Lungs- clear without rales, wheezes   Cor- regular rate and rhythm, no murmur , gallop   Abdomen- soft, non-tender   Extremities - warm, non-tender, minimal edema   Neuro- oriented, appropriate, no focal weakness   Lab Results:  Recent Labs  12/13/16 1602 12/13/16 1610 12/14/16 0513  WBC 15.8*  --  14.7*  HGB 10.6* 10.5* 9.5*  HCT 32.3* 31.0* 28.2*  PLT 195  --  178   BMET:  Recent Labs  12/13/16 0308  12/13/16 1610 12/14/16 0513  NA 136  --  135 134*  K 4.4  --  4.4 4.0  CL 104  --  99* 99*  CO2 23  --   --  27  GLUCOSE 119*  --  154* 111*  BUN 7  --  9 12  CREATININE 0.58  < > 0.60 0.70  CALCIUM 8.3*  --   --  8.5*  < > = values in this interval not displayed.  PT/INR:  Recent Labs  12/12/16 1433  LABPROT 16.4*  INR 1.33   ABG    Component Value Date/Time   PHART 7.333 (L) 12/12/2016 1758   HCO3 23.7 12/12/2016  1758   TCO2 26 12/13/2016 1610   ACIDBASEDEF 2.0 12/12/2016 1758   O2SAT 99.0 12/12/2016 1758   CBG (last 3)   Recent Labs  12/13/16 2355 12/14/16 0406 12/14/16 0751  GLUCAP 141* 110* 117*    Assessment/Plan: S/P Procedure(s) (LRB): CORONARY ARTERY BYPASS GRAFTING (CABG) x four , using left internal mammary artery and right leg greater saphenous vein harvested endoscopically (N/A) TRANSESOPHAGEAL ECHOCARDIOGRAM (TEE) (N/A) tx stepdown diuresis   LOS: 6 days    Monica Lynch 12/14/2016

## 2016-12-15 ENCOUNTER — Inpatient Hospital Stay (HOSPITAL_COMMUNITY): Payer: Self-pay

## 2016-12-15 LAB — CBC
HCT: 24.4 % — ABNORMAL LOW (ref 36.0–46.0)
Hemoglobin: 8.1 g/dL — ABNORMAL LOW (ref 12.0–15.0)
MCH: 30.7 pg (ref 26.0–34.0)
MCHC: 33.2 g/dL (ref 30.0–36.0)
MCV: 92.4 fL (ref 78.0–100.0)
Platelets: 157 10*3/uL (ref 150–400)
RBC: 2.64 MIL/uL — ABNORMAL LOW (ref 3.87–5.11)
RDW: 13 % (ref 11.5–15.5)
WBC: 11.5 10*3/uL — ABNORMAL HIGH (ref 4.0–10.5)

## 2016-12-15 LAB — BASIC METABOLIC PANEL
Anion gap: 5 (ref 5–15)
BUN: 12 mg/dL (ref 6–20)
CO2: 26 mmol/L (ref 22–32)
Calcium: 8.3 mg/dL — ABNORMAL LOW (ref 8.9–10.3)
Chloride: 103 mmol/L (ref 101–111)
Creatinine, Ser: 0.61 mg/dL (ref 0.44–1.00)
GFR calc Af Amer: >60 mL/min (ref >60)
GFR calc non Af Amer: >60 mL/min (ref >60)
Glucose, Bld: 97 mg/dL (ref 65–99)
Potassium: 3.5 mmol/L (ref 3.5–5.1)
Sodium: 134 mmol/L — ABNORMAL LOW (ref 135–145)

## 2016-12-15 LAB — HEMOGLOBIN AND HEMATOCRIT, BLOOD
HEMATOCRIT: 26 % — AB (ref 36.0–46.0)
Hemoglobin: 8.5 g/dL — ABNORMAL LOW (ref 12.0–15.0)

## 2016-12-15 MED ORDER — POTASSIUM CHLORIDE CRYS ER 20 MEQ PO TBCR
40.0000 meq | EXTENDED_RELEASE_TABLET | Freq: Once | ORAL | Status: AC
  Administered 2016-12-15: 40 meq via ORAL
  Filled 2016-12-15: qty 2

## 2016-12-15 MED ORDER — FE FUMARATE-B12-VIT C-FA-IFC PO CAPS
1.0000 | ORAL_CAPSULE | Freq: Three times a day (TID) | ORAL | Status: DC
  Administered 2016-12-15 – 2016-12-17 (×5): 1 via ORAL
  Filled 2016-12-15 (×5): qty 1

## 2016-12-15 NOTE — Plan of Care (Signed)
Problem: Activity: Goal: Risk for activity intolerance will decrease Outcome: Completed/Met Date Met: 12/15/16 Pt up ad lib, walked x3 today

## 2016-12-15 NOTE — Progress Notes (Signed)
CARDIAC REHAB PHASE I   PRE:  Rate/Rhythm: 88 SR  BP:  Sitting: 164/71        SaO2: 98 RA  MODE:  Ambulation: 470 ft   POST:  Rate/Rhythm: 107 ST  BP:  Sitting: 121/85         SaO2: 99 RA  Pt ambulated 470 ft on RA, independent, steady gait, tolerated well with no complaints. Cardiac surgery discharge education completed. Reviewed risk factors, IS, sternal precautions, activity progression, exercise, heart healthy diet and phase 2 cardiac rehab. Pt verbalized understanding Pt declines phase 2 cardiac rehab referral at this time. Pt up ad lib in room, call bell within reach. Encouraged IS, additional ambulation as tolerated. Will follow.   [id number] Joylene Grapes, RN, BSN 12/15/2016 11:50 AM

## 2016-12-15 NOTE — Discharge Summary (Signed)
Physician Discharge Summary       [address] [place]       [place] [address]             [phone]    Patient ID: Monica Lynch MRN: [id number] DOB/AGE: [birth year] 54 y.o.  Admit date: 12/08/2016 Discharge date: 12/17/2016  Admission Diagnoses: 1. Unstable angina (HCC) 2. Coronary artery disease  Active Diagnoses:  1. Essential hypertension 2. Osteoarthritis 3. Asthma 4. Cataract 5. Seasonal allergy 6. ABL anemia     Procedure (s):  LEFT HEART CATH AND CORONARY ANGIOGRAPHY by Dr. Okey Dupre on 12/11/2016:  Conclusion   Conclusions: 1. Severe three-vessel coronary artery disease, as detailed below. 2. Inferior hypokinesis with otherwise preserved left ventricular contraction fraction. LVEF 45-50%. 3. Normal left ventricular filling pressure.  Recommendations: 1. Cardiac surgery consultation for CABG, given severe three-vessel CAD. 2. Restart heparin infusion 2 hours after removal of TR band. 3. Aggressive secondary prevention. 4. Transthoracic echocardiogram.  Yvonne Kendall, MD [facility] Pager: [phone]    1. Coronary artery bypass grafting x4 (left internal mammary artery to     left anterior descending, saphenous vein graft to posterior     descending, saphenous vein graft to diagonal, saphenous vein graft     to OM-1). 2. Endoscopic harvest of right leg greater saphenous vein by Dr. Donata Clay on 12/12/2016.  History of Presenting Illness: This is a 56 year old Caucasian female who for the past 4-6 months has had intermittent episodes of chest discomfort at rest that last 2-3 minutes and occur every 2-3 months.  The episodes are associated with a pressure like sensation over her chest and are associated with dyspnea.  She told her PCP about this 1 month ago who referred her to cardiology.  Dr. Verne Carrow saw her on 9/21 and ordered a nuclear stress test and started her on metoprolol and aspirin.  She had her nuclear stress test today (yhis  demonstrated severe three-vessel coronary disease, EF 45%, LVEDP 15) during which she states she had reproduction of her symptoms (chest pressure, out of breath) though less in severity than her symptoms she had been having at home. Her ECG demonstrated ST depressions and nuclear scan showed inferior ischemia so she was advised to go to the ED for heparin drip and heart catheterization.   She states that she has no personal history of heart disease and is a lifetime non-smoker.  States that her mother had CAD in her 74s and a brother with an MI in his 12s.  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  The patient has been stable without chest pain or shortness of breath while hospitalized. Preoperative PFTs show adequate mechanics and diffusion capacity. Carotid Dopplers showed no evidence of significant internal carotid artery stenosis bilaterally. Chest x-ray is clear. Dr. Donata Clay discussed the need for coronary artery bypass grafting surgery. Potential risks, benefits, and complications of the surgery were discussed with the patient and she agreed to proceed with surgery. She underwent a CABG x 4 on 12/12/2016.  Brief Hospital Course:  The patient was extubated the evening of surgery without difficulty. She remained afebrile and hemodynamically stable. Theone Murdoch, a line, chest tubes, and foley were removed early in the post operative course. Lopressor was started and titrated accordingly. She was volume over loaded and diuresed. She had ABL anemia. She did not require a post op transfusion. Last H and H was 8.5 and  26. She was started on Trinsicon. She was weaned off the insulin drip. The patient's HGA1C pre op was 5.1  . The patient was felt surgically stable for transfer from the ICU to [facility]U for further convalescence on 12/14/2016. She continues to progress with cardiac rehab. She was ambulating on room air. On tele, she did have a pause followed by brady  cardia for a few days. She then had a pause on 10/07 but no further bradycardia. She remained on Lopressor 12.5 mg bid and this was NOT increased. Per Dr. Donata Clay, at discharge, ecasa was decreased to 81 mg and she was started on Plavix 75 mg daily, which she will need for one month.She has been tolerating a diet and has had a bowel movement. Epicardial pacing wires were removed on 12/16/2016. Chest tube sutures will be removed the day of discharge. RLE sutures will remain and be removed in the office after discharge .As discussed with Dr. Donata Clay,  patient is felt surgically stable for discharge today.   Latest Vital Signs: Blood pressure 135/78, pulse 83, temperature 98.4 F (36.9 C), temperature source Oral, resp. rate (!) 33, height  (1.575 m), weight 170 lb 3.2 oz (77.2 kg), SpO2 98 %.  Physical Exam: Cardiovascular: RRR, no murmur Pulmonary: Clear to auscultation bilaterally Abdomen: Soft, non tender, bowel sounds present. Extremities: Mild bilateral lower extremity edema. Wounds: Clean and dry.  No erythema or signs of infection.  Discharge Condition: Stable and discharged to home.  Recent laboratory studies:  Lab Results  Component Value Date   WBC 11.5 (H) 12/15/2016   HGB 8.5 (L) 12/15/2016   HCT 26.0 (L) 12/15/2016   MCV 92.4 12/15/2016   PLT 157 12/15/2016   Lab Results  Component Value Date   NA 134 (L) 12/15/2016   K 3.5 12/15/2016   CL 103 12/15/2016   CO2 26 12/15/2016   CREATININE 0.61 12/15/2016   GLUCOSE 97 12/15/2016      Diagnostic Studies:  Dg Chest Port 1 View  Result Date: 12/15/2016 CLINICAL DATA:  History of coronary bypass grafting EXAM: PORTABLE CHEST 1 VIEW COMPARISON:  12/14/2016 FINDINGS: Cardiac shadow is at the upper limits of normal in size. Postsurgical changes are again seen. Right jugular sheath has been removed in the interval. No pneumothorax is seen. Minimal left retrocardiac atelectasis is noted stable from the prior study. No  new focal abnormality is noted. IMPRESSION: No acute abnormality seen.  Stable left basilar atelectasis is seen. Electronically Signed   By: Alcide Clever M.D.   On: 12/15/2016 07:27   Discharge Medications: Allergies as of 12/17/2016      Reactions   Tape Dermatitis   blister      Medication List    STOP taking these medications   nitroGLYCERIN 0.4 MG SL tablet Commonly known as:  NITROSTAT     TAKE these medications   acetaminophen 500 MG tablet Commonly known as:  TYLENOL Take 500 mg by mouth every 6 (six) hours as needed for headache.   aspirin EC 81 MG tablet Take 1 tablet (81 mg total) by mouth daily.   atorvastatin 80 MG tablet Commonly known as:  LIPITOR Take 1 tablet (80 mg total) by mouth daily at 6 PM.   clopidogrel 75 MG tablet Commonly known as:  PLAVIX Take 1 tablet (75 mg total) by mouth daily.   ferrous sulfate 325 (65 FE) MG tablet Take 1 tablet (325 mg total) by mouth daily. For one month then  stop.   furosemide 40 MG tablet Commonly known as:  LASIX Take 1 tablet (40 mg total) by mouth daily. For 5 days then stop.   lisinopril 2.5 MG tablet Commonly known as:  PRINIVIL,ZESTRIL Take 1 tablet (2.5 mg total) by mouth daily.   metoprolol tartrate 25 MG tablet Commonly known as:  LOPRESSOR Take 0.5 tablets (12.5 mg total) by mouth 2 (two) times daily. What changed:  how much to take   potassium chloride SA 20 MEQ tablet Commonly known as:  K-DUR,KLOR-CON Take 1 tablet (20 mEq total) by mouth daily. For 5 days then stop.   traMADol 50 MG tablet Commonly known as:  ULTRAM Take 50 mg by mouth every 4-6 hours PRN severe pain.      The patient has been discharged on:   1.Beta Blocker:  Yes [ x  ]                              No   [   ]                              If No, reason:  2.Ace Inhibitor/ARB: Yes [ x  ]                                     No  [    ]                                     If No, reason:  3.Statin:   Yes [ x  ]                   No  [   ]                  If No, reason:  4.Ecasa:  Yes  [ x  ]                  No   [   ]                  If No, reason:

## 2016-12-15 NOTE — Progress Notes (Signed)
      [address]       [place] [address]             [phone]        3 Days Post-Op Procedure(s) (LRB): CORONARY ARTERY BYPASS GRAFTING (CABG) x four , using left internal mammary artery and right leg greater saphenous vein harvested endoscopically (N/A) TRANSESOPHAGEAL ECHOCARDIOGRAM (TEE) (N/A)  Subjective: Patient has loose stools, but has not noticed any blood in urine or stool.  Objective: Vital signs in last 24 hours: Temp:  [98.5 F (36.9 C)-99 F (37.2 C)] 98.9 F (37.2 C) (10/05 0435) Pulse Rate:  [62-85] 81 (10/05 0435) Cardiac Rhythm: Normal sinus rhythm (10/05 0400) Resp:  [13-31] 20 (10/05 0435) BP: (109-131)/(61-75) 127/75 (10/05 0435) SpO2:  [91 %-97 %] 97 % (10/05 0435) Weight:  [173 lb 11.2 oz (78.8 kg)] 173 lb 11.2 oz (78.8 kg) (10/05 0435)  Pre op weight 78 kg Current Weight  12/15/16 173 lb 11.2 oz (78.8 kg)      Intake/Output from previous day: 10/04 0701 - 10/05 0700 In: 730 [P.O.:720; I.V.:10] Out: 500 [Urine:500]   Physical Exam:  Cardiovascular: RRR, no murmur Pulmonary: Clear to auscultation bilaterally Abdomen: Soft, non tender, bowel sounds present. Extremities: Mild bilateral lower extremity edema. Wounds: Aquacel removed-is clean and dry.  No erythema or signs of infection.  Lab Results: CBC: Recent Labs  12/14/16 0513 12/15/16 0304  WBC 14.7* 11.5*  HGB 9.5* 8.1*  HCT 28.2* 24.4*  PLT 178 157   BMET:  Recent Labs  12/14/16 0513 12/15/16 0304  NA 134* 134*  K 4.0 3.5  CL 99* 103  CO2 27 26  GLUCOSE 111* 97  BUN 12 12  CREATININE 0.70 0.61  CALCIUM 8.5* 8.3*    PT/INR:  Lab Results  Component Value Date   INR 1.33 12/12/2016   INR 1.02 12/11/2016   INR 0.99 12/08/2016   ABG:  INR: Will add last result for INR, ABG once components are confirmed Will add last 4 CBG results once components are confirmed  Assessment/Plan:  1. CV - Pause earlier this am. SR in the 70-80's this am.  On Lopressor 12.5 mg bid 2.  Pulmonary - On room air. CXR this am shows no pneumothorax, left base atelectasis. Encourage incentive spirometer. 3. Volume Overload - On Lasix 40 mg IV daily 4.  Acute blood loss anemia - H and H decreased from 9.5 and 28.2 to 8.1 and 24.4. Will recheck. Continue Trinsicon 5. Supplement potassium 6. Remove EPW in am 7. Stop stool softeners 8. Home this weekend  Naia Ruff MPA-C 12/15/2016,7:57 AM

## 2016-12-15 NOTE — Discharge Instructions (Signed)

## 2016-12-16 MED ORDER — FUROSEMIDE 40 MG PO TABS
40.0000 mg | ORAL_TABLET | Freq: Every day | ORAL | Status: DC
  Administered 2016-12-16 – 2016-12-17 (×2): 40 mg via ORAL
  Filled 2016-12-16 (×2): qty 1

## 2016-12-16 MED ORDER — LISINOPRIL 2.5 MG PO TABS
2.5000 mg | ORAL_TABLET | Freq: Every day | ORAL | Status: DC
  Administered 2016-12-16 – 2016-12-17 (×2): 2.5 mg via ORAL
  Filled 2016-12-16 (×2): qty 1

## 2016-12-16 MED FILL — Magnesium Sulfate Inj 50%: INTRAMUSCULAR | Qty: 10 | Status: AC

## 2016-12-16 MED FILL — Heparin Sodium (Porcine) Inj 1000 Unit/ML: INTRAMUSCULAR | Qty: 30 | Status: AC

## 2016-12-16 MED FILL — Potassium Chloride Inj 2 mEq/ML: INTRAVENOUS | Qty: 40 | Status: AC

## 2016-12-16 NOTE — Progress Notes (Addendum)
      [address]       [place] [address]             [phone]        4 Days Post-Op Procedure(s) (LRB): CORONARY ARTERY BYPASS GRAFTING (CABG) x four , using left internal mammary artery and right leg greater saphenous vein harvested endoscopically (N/A) TRANSESOPHAGEAL ECHOCARDIOGRAM (TEE) (N/A)  Subjective: Patient without complaints.  Objective: Vital signs in last 24 hours: Temp:  [98 F (36.7 C)-98.4 F (36.9 C)] 98.2 F (36.8 C) (10/06 0514) Pulse Rate:  [79-96] 86 (10/06 0514) Cardiac Rhythm: Normal sinus rhythm (10/05 1900) Resp:  [13-28] 22 (10/06 0514) BP: (118-164)/(71-82) 144/82 (10/06 0514) SpO2:  [96 %-98 %] 96 % (10/06 0514) Weight:  [170 lb 12.8 oz (77.5 kg)] 170 lb 12.8 oz (77.5 kg) (10/06 0500)  Pre op weight 78 kg Current Weight  12/16/16 170 lb 12.8 oz (77.5 kg)      Intake/Output from previous day: 10/05 0701 - 10/06 0700 In: 135 [P.O.:125; I.V.:10] Out: 2 [Urine:1; Stool:1]   Physical Exam:  Cardiovascular: RRR, no murmur Pulmonary: Clear to auscultation bilaterally Abdomen: Soft, non tender, bowel sounds present. Extremities: Mild bilateral lower extremity edema. Wounds: Clean and dry.  No erythema or signs of infection.  Lab Results: CBC:  Recent Labs  12/14/16 0513 12/15/16 0304 12/15/16 0928  WBC 14.7* 11.5*  --   HGB 9.5* 8.1* 8.5*  HCT 28.2* 24.4* 26.0*  PLT 178 157  --    BMET:   Recent Labs  12/14/16 0513 12/15/16 0304  NA 134* 134*  K 4.0 3.5  CL 99* 103  CO2 27 26  GLUCOSE 111* 97  BUN 12 12  CREATININE 0.70 0.61  CALCIUM 8.5* 8.3*    PT/INR:  Lab Results  Component Value Date   INR 1.33 12/12/2016   INR 1.02 12/11/2016   INR 0.99 12/08/2016   ABG:  INR: Will add last result for INR, ABG once components are confirmed Will add last 4 CBG results once components are confirmed  Assessment/Plan:  1. CV - She has had a couple of pauses and brief bradycardia over last 36 hours.  SR in the 80's this am. On Lopressor 12.5 mg bid. Will start low dose Lisinopril. Will start Plavix in am as EPW will be removed today. 2.  Pulmonary - On room air.Encourage incentive spirometer. 3. Volume Overload - On Lasix 40 mg IV daily. Will change to oral this am. 4.  Acute blood loss anemia - H and H stable at 8.5 and 26. On Trinsicon but will continue oral ferrous at discharge 5. Remove EPW  6. Likely discharge in am  ZIMMERMAN,DONIELLE MPA-C 12/16/2016,9:16 AM patient examined and medical record reviewed,agree with above note. Kathlee Nations Trigt III 12/16/2016

## 2016-12-16 NOTE — Progress Notes (Signed)
EPWs pulled per protocol, pt tolerated well.  Will continue to monitor.

## 2016-12-17 MED ORDER — CLOPIDOGREL BISULFATE 75 MG PO TABS: 75.0000 mg | ORAL_TABLET | Freq: Every day | ORAL | 0 refills | Status: DC

## 2016-12-17 MED ORDER — ATORVASTATIN CALCIUM 80 MG PO TABS: 80.0000 mg | ORAL_TABLET | Freq: Every day | ORAL | 1 refills | Status: DC

## 2016-12-17 MED ORDER — FUROSEMIDE 40 MG PO TABS: 40.0000 mg | ORAL_TABLET | Freq: Every day | ORAL | 0 refills | Status: DC

## 2016-12-17 MED ORDER — LISINOPRIL 2.5 MG PO TABS: 2.5000 mg | ORAL_TABLET | Freq: Every day | ORAL | 1 refills | Status: DC

## 2016-12-17 MED ORDER — TRAMADOL HCL 50 MG PO TABS: ORAL_TABLET | ORAL | 0 refills | Status: DC

## 2016-12-17 MED ORDER — FERROUS SULFATE 325 (65 FE) MG PO TABS: 325.0000 mg | ORAL_TABLET | Freq: Every day | ORAL | 3 refills | Status: DC

## 2016-12-17 MED ORDER — METOPROLOL TARTRATE 25 MG PO TABS: 12.5000 mg | ORAL_TABLET | Freq: Two times a day (BID) | ORAL | 1 refills | Status: DC

## 2016-12-17 MED ORDER — POTASSIUM CHLORIDE CRYS ER 20 MEQ PO TBCR: 20.0000 meq | EXTENDED_RELEASE_TABLET | Freq: Every day | ORAL | 0 refills | Status: DC

## 2016-12-17 NOTE — Progress Notes (Signed)
      [address]       [place] [address]             [phone]        5 Days Post-Op Procedure(s) (LRB): CORONARY ARTERY BYPASS GRAFTING (CABG) x four , using left internal mammary artery and right leg greater saphenous vein harvested endoscopically (N/A) TRANSESOPHAGEAL ECHOCARDIOGRAM (TEE) (N/A)  Subjective: Patient without complaints and really wants to go home.  Objective: Vital signs in last 24 hours: Temp:  [98.3 F (36.8 C)-98.7 F (37.1 C)] 98.4 F (36.9 C) (10/07 0540) Pulse Rate:  [82-83] 83 (10/07 0540) Cardiac Rhythm: Normal sinus rhythm (10/07 0701) Resp:  [18-33] 33 (10/07 0540) BP: (110-135)/(70-78) 135/78 (10/07 0540) SpO2:  [97 %-98 %] 98 % (10/07 0540) Weight:  [170 lb 3.2 oz (77.2 kg)] 170 lb 3.2 oz (77.2 kg) (10/07 0540)  Pre op weight 78 kg Current Weight  12/17/16 170 lb 3.2 oz (77.2 kg)      Intake/Output from previous day: 10/06 0701 - 10/07 0700 In: -  Out: 1 [Urine:1]   Physical Exam:  Cardiovascular: RRR, no murmur Pulmonary: Clear to auscultation bilaterally Abdomen: Soft, non tender, bowel sounds present. Extremities: Mild bilateral lower extremity edema. Wounds: Clean and dry.  No erythema or signs of infection.  Lab Results: CBC:  Recent Labs  12/15/16 0304 12/15/16 0928  WBC 11.5*  --   HGB 8.1* 8.5*  HCT 24.4* 26.0*  PLT 157  --    BMET:   Recent Labs  12/15/16 0304  NA 134*  K 3.5  CL 103  CO2 26  GLUCOSE 97  BUN 12  CREATININE 0.61  CALCIUM 8.3*    PT/INR:  Lab Results  Component Value Date   INR 1.33 12/12/2016   INR 1.02 12/11/2016   INR 0.99 12/08/2016   ABG:  INR: Will add last result for INR, ABG once components are confirmed Will add last 4 CBG results once components are confirmed  Assessment/Plan:  1. CV - She has had a couple of pauses and brief bradycardia over last 36 hours. She had a pause again earlier today but no bradycardia afterward. SR in the 80's this  am. On Lopressor 12.5 mg bid and Lisinopril 2.5 mg daily. As discussed with Dr. Donata Clay, will start Plavix  2.  Pulmonary - On room air.Encourage incentive spirometer. 3. Volume Overload - On Lasix 40 mg PO daily 4.  Acute blood loss anemia - H and H stable at 8.5 and 26. On Trinsicon but will continue oral ferrous at discharge 6. Discharge  ZIMMERMAN,DONIELLE MPA-C 12/17/2016,8:54 AM

## 2016-12-26 ENCOUNTER — Ambulatory Visit (INDEPENDENT_AMBULATORY_CARE_PROVIDER_SITE_OTHER): Payer: Self-pay

## 2016-12-26 DIAGNOSIS — Z4802 Encounter for removal of sutures: Secondary | ICD-10-CM

## 2016-12-26 NOTE — Progress Notes (Signed)
Removed 10 sutures from [facility] site, with no signs of infection and patient tolerated well. Reviewed signs of infection with patient. She was instructed to call the office if they develop.

## 2017-01-01 ENCOUNTER — Encounter (HOSPITAL_COMMUNITY): Payer: Self-pay | Admitting: Cardiothoracic Surgery

## 2017-01-01 NOTE — Anesthesia Postprocedure Evaluation (Signed)
Anesthesia Post Note  Patient: Monica Lynch  Procedure(s) Performed: CORONARY ARTERY BYPASS GRAFTING (CABG) x four , using left internal mammary artery and right leg greater saphenous vein harvested endoscopically (N/A Chest) TRANSESOPHAGEAL ECHOCARDIOGRAM (TEE) (N/A )     Patient location during evaluation: SICU Anesthesia Type: General Level of consciousness: sedated Pain management: pain level controlled Vital Signs Assessment: post-procedure vital signs reviewed and stable Respiratory status: patient remains intubated per anesthesia plan Cardiovascular status: stable Postop Assessment: no apparent nausea or vomiting Anesthetic complications: no    Last Vitals:  Vitals:   12/16/16 2116 12/17/16 0540  BP: 110/71 135/78  Pulse: 83 83  Resp:  (!) 33  Temp: 36.8 C 36.9 C  SpO2: 97% 98%    Last Pain:  Vitals:   12/17/16 0540  TempSrc: Oral  PainSc:                  Monica Lynch

## 2017-01-05 ENCOUNTER — Ambulatory Visit (INDEPENDENT_AMBULATORY_CARE_PROVIDER_SITE_OTHER): Payer: Self-pay | Admitting: Cardiology

## 2017-01-05 ENCOUNTER — Encounter: Payer: Self-pay | Admitting: Cardiology

## 2017-01-05 VITALS — BP 138/80 | HR 77 | Resp 19 | Ht 62.0 in | Wt 169.1 lb

## 2017-01-05 DIAGNOSIS — Z951 Presence of aortocoronary bypass graft: Secondary | ICD-10-CM

## 2017-01-05 DIAGNOSIS — I10 Essential (primary) hypertension: Secondary | ICD-10-CM

## 2017-01-05 DIAGNOSIS — E785 Hyperlipidemia, unspecified: Secondary | ICD-10-CM

## 2017-01-05 NOTE — Patient Instructions (Signed)
Medication Instructions:  Your physician recommends that you continue on your current medications as directed. Please refer to the Current Medication list given to you today.  1. Avoid all over-the-counter antihistamines except Claritin/Loratadine and Zyrtec/Cetrizine. 2. Avoid all combination including cold sinus allergies flu decongestant and sleep medications 3. You can use Robitussin DM Mucinex and Mucinex DM for cough. 4. can use Tylenol aspirin ibuprofen and naproxen but no combinations such as sleep or sinus.  Labwork: None   Testing/Procedures: You have been [facility]. They will give you a call regarding information and appointment.   Follow-Up: Your physician recommends that you schedule a follow-up appointment in: 6 weeks  Any Other Special Instructions Will Be Listed Below (If Applicable).  Please note that any paperwork needing to be filled out by the provider will need to be addressed at the front desk prior to seeing the provider. Please note that any paperwork FMLA, Disability or other documents regarding health condition is subject to a $25.00 charge that must be received prior to completion of paperwork in the form of a money order or check.    If you need a refill on your cardiac medications before your next appointment, please call your pharmacy.

## 2017-01-05 NOTE — Progress Notes (Signed)
Cardiology Office Note:    Date:  01/05/2017   ID:  Monica Lynch, DOB [birth year], MRN [id number]  PCP:  Sharlene Dory, DO  Cardiologist:  Gypsy Balsam, MD    Referring MD: Sharlene Dory*   Chief Complaint  Patient presents with  . Follow-up    follow up s/p CABG   I am doing great  History of Present Illness:    Monica Lynch is a 56 y.o. female with coronary artery disease.  I saw her first him about a month and a half ago she had very suspicious symptoms end up having stress test which was very abnormal cardiac catheterization was done which showed triple-vessel disease after that she underwent coronary artery bypass graft.  She is coming today for follow-up 3 weeks after surgery.  She is doing amazingly well when asked her what was worse about all the encounter and cardiac catheterization and bypass surgery she tells me that the biggest problem she experienced was the fact that for 3 weeks she was not able to take shower.  Denies having any chest pain tightness squeezing pressure burning chest.  She can walk with no difficulties overall she says she feels much better because she does have any chest pain when she walks.  Wound healed completely already.  We spent a great deal of time talking with risk factors modifications I explained to her what cardiac catheterization show I explained to her what was done in terms of bypass surgery and we described grafts reviewed all medications.  I strongly recommend rehab.  She will consider  Past Medical History:  Diagnosis Date  . Asthma   . Cataract    current early stage  . Essential hypertension 12/01/2016  . Osteoarthritis   . Seasonal allergies     Past Surgical History:  Procedure Laterality Date  . CORONARY ARTERY BYPASS GRAFT N/A 12/12/2016   Procedure: CORONARY ARTERY BYPASS GRAFTING (CABG) x four , using left internal mammary artery and right leg greater saphenous vein harvested endoscopically;   Surgeon: Kerin Perna, MD;  Location: [facility] OR;  Service: Open Heart Surgery;  Laterality: N/A;  . LEFT HEART CATH AND CORONARY ANGIOGRAPHY N/A 12/11/2016   Procedure: LEFT HEART CATH AND CORONARY ANGIOGRAPHY;  Surgeon: Yvonne Kendall, MD;  Location: [facility] INVASIVE CV LAB;  Service: Cardiovascular;  Laterality: N/A;  . screws removed from ankles    . TEE WITHOUT CARDIOVERSION N/A 12/12/2016   Procedure: TRANSESOPHAGEAL ECHOCARDIOGRAM (TEE);  Surgeon: Donata Clay, Theron Arista, MD;  Location: [facility] OR;  Service: Open Heart Surgery;  Laterality: N/A;  . TUBAL LIGATION      Current Medications: Current Meds  Medication Sig  . acetaminophen (TYLENOL) 500 MG tablet Take 500 mg by mouth every 6 (six) hours as needed for headache.  Marland Kitchen aspirin EC 81 MG tablet Take 1 tablet (81 mg total) by mouth daily.  Marland Kitchen atorvastatin (LIPITOR) 80 MG tablet Take 1 tablet (80 mg total) by mouth daily at 6 PM.  . clopidogrel (PLAVIX) 75 MG tablet Take 1 tablet (75 mg total) by mouth daily.  . ferrous sulfate 325 (65 FE) MG tablet Take 1 tablet (325 mg total) by mouth daily. For one month then stop.  Marland Kitchen lisinopril (PRINIVIL,ZESTRIL) 2.5 MG tablet Take 1 tablet (2.5 mg total) by mouth daily.  . metoprolol tartrate (LOPRESSOR) 25 MG tablet Take 0.5 tablets (12.5 mg total) by mouth 2 (two) times daily.  . traMADol (ULTRAM) 50 MG tablet Take 50 mg by mouth every  4-6 hours PRN severe pain.     Allergies:   Tape   Social History   Social History  . Marital status: Married    Spouse name: N/A  . Number of children: N/A  . Years of education: N/A   Social History Main Topics  . Smoking status: Never Smoker  . Smokeless tobacco: Never Used  . Alcohol use Yes     Comment: occasional  . Drug use: No  . Sexual activity: Not Asked   Other Topics Concern  . None   Social History Narrative  . None     Family History: The patient's family history includes COPD in her father; Heart attack (age of onset: 63) in her brother; Heart  disease in her mother; Hyperlipidemia in her father, maternal grandfather, and mother; Hypertension in her mother. ROS:   Please see the history of present illness.    All 14 point review of systems negative except as described per history of present illness  EKGs/Labs/Other Studies Reviewed:      Recent Labs: 12/11/2016: ALT 22 12/12/2016: TSH 4.635 12/13/2016: Magnesium 2.0 12/15/2016: BUN 12; Creatinine, Ser 0.61; Hemoglobin 8.5; Platelets 157; Potassium 3.5; Sodium 134  Recent Lipid Panel    Component Value Date/Time   CHOL 213 (H) 12/01/2016 0955   TRIG 129 12/01/2016 0955   HDL 50 12/01/2016 0955   CHOLHDL 4.3 12/01/2016 0955   LDLCALC 137 (H) 12/01/2016 0955    Physical Exam:    VS:  BP 138/80 (BP Location: Left Arm, Patient Position: Sitting, Cuff Size: Normal)   Pulse 77   Resp 19   Ht 5\' 2"  (1.575 m)   Wt 169 lb 1.3 oz (76.7 kg)   SpO2 99%   BMI 30.93 kg/m     Wt Readings from Last 3 Encounters:  01/05/17 169 lb 1.3 oz (76.7 kg)  12/17/16 170 lb 3.2 oz (77.2 kg)  12/08/16 175 lb (79.4 kg)     GEN:  Well nourished, well developed in no acute distress HEENT: Normal NECK: No JVD; No carotid bruits LYMPHATICS: No lymphadenopathy CARDIAC: RRR, no murmurs, no rubs, no gallops RESPIRATORY:  Clear to auscultation without rales, wheezing or rhonchi  ABDOMEN: Soft, non-tender, non-distended MUSCULOSKELETAL:  No edema; No deformity  SKIN: Warm and dry LOWER EXTREMITIES: no swelling NEUROLOGIC:  Alert and oriented x 3 PSYCHIATRIC:  Normal affect   ASSESSMENT:    1. S/P CABG x 4   2. Status post coronary artery bypass graft: LIMA to LAD, SVG to PDA, SVG to diagonal 1, SVG to obtuse marginal 1 ejection fraction was normal.  Done in October 2018   3. Essential hypertension   4. Dyslipidemia    PLAN:    In order of problems listed above:  1. Status post coronary artery bypass graft x4.  Doing amazingly well continue present management 2. Essential hypertension:  Blood pressure appears to be controlled continue present management. 3. Dyslipidemia: We will get fasting lipid profile within the next 2-3 weeks.  Overall she is doing amazingly well after bypass surgery will continue present management.  I encouraged her to walk on a regular basis.  Still sternal precaution.   Medication Adjustments/Labs and Tests Ordered: Current medicines are reviewed at length with the patient today.  Concerns regarding medicines are outlined above.  Orders Placed This Encounter  Procedures  . AMB referral to cardiac rehabilitation   Medication changes: No orders of the defined types were placed in this encounter.   Signed, Molly Maduro  Abelino DerrickJ. Murel Shenberger, MD, [facility] 01/05/2017 9:12 AM    [facility]

## 2017-01-15 ENCOUNTER — Other Ambulatory Visit: Payer: Self-pay | Admitting: Cardiology

## 2017-01-16 ENCOUNTER — Other Ambulatory Visit: Payer: Self-pay | Admitting: Cardiothoracic Surgery

## 2017-01-16 DIAGNOSIS — Z951 Presence of aortocoronary bypass graft: Secondary | ICD-10-CM

## 2017-01-17 ENCOUNTER — Ambulatory Visit (INDEPENDENT_AMBULATORY_CARE_PROVIDER_SITE_OTHER): Payer: Self-pay | Admitting: Cardiothoracic Surgery

## 2017-01-17 ENCOUNTER — Other Ambulatory Visit: Payer: Self-pay

## 2017-01-17 ENCOUNTER — Ambulatory Visit
Admission: RE | Admit: 2017-01-17 | Discharge: 2017-01-17 | Disposition: A | Discharge status: Home / Self Care | Payer: Self-pay | Source: Ambulatory Visit | Attending: Cardiothoracic Surgery | Admitting: Cardiothoracic Surgery

## 2017-01-17 ENCOUNTER — Encounter: Payer: Self-pay | Admitting: Cardiothoracic Surgery

## 2017-01-17 VITALS — BP 108/68 | HR 62 | Resp 16 | Ht 62.0 in | Wt 164.0 lb

## 2017-01-17 DIAGNOSIS — Z951 Presence of aortocoronary bypass graft: Secondary | ICD-10-CM

## 2017-01-17 DIAGNOSIS — I2510 Atherosclerotic heart disease of native coronary artery without angina pectoris: Secondary | ICD-10-CM

## 2017-01-17 NOTE — Progress Notes (Signed)
PCP is Wendling, Jilda RocheNicholas Paul, DO Referring Provider is Georgeanna LeaKrasowski, Robert J, MD  Chief Complaint  Patient presents with  . Routine Post Op    s/p CABG x 4 12/12/16 with a CXR    HPI: Postop follow-up after CABG x4, preoperative unstable angina. Patient had uncomplicated postop course. She continues to do well without recurrent angina.  Surgical incision is healing well.  No symptoms of CHF. Chest x-ray done today shows clear lung fields, sternal wires intact Patient finished a 30-day postop course of Plavix.  She will not need Plavix long-term.  Patient lives in [place] and is interested in outpatient cardiac rehab. 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 Asthma   . Cataract    current early stage  . Essential hypertension 12/01/2016  . Osteoarthritis   . Seasonal allergies     Past Surgical History:  Procedure Laterality Date  . screws removed from ankles    . TUBAL LIGATION      Family History  Problem Relation Age of Onset  . Heart disease Mother   . Hypertension Mother   . Hyperlipidemia Mother   . COPD Father   . Hyperlipidemia Father   . Heart attack Brother 42  . Hyperlipidemia Maternal Grandfather     Social History Social History   Tobacco Use  . Smoking status: Never Smoker  . Smokeless tobacco: Never Used  Substance Use Topics  . Alcohol use: Yes    Comment: occasional  . Drug use: No    Current Outpatient Medications  Medication Sig Dispense Refill  . acetaminophen (TYLENOL) 500 MG tablet Take 500 mg by mouth every 6 (six) hours as needed for headache.    Marland Kitchen. aspirin EC 81 MG tablet Take 1 tablet (81 mg total) by mouth daily. 90 tablet 3  . atorvastatin (LIPITOR) 80 MG tablet Take 1 tablet (80 mg total) by mouth daily at  6 PM. 30 tablet 1  . clopidogrel (PLAVIX) 75 MG tablet TAKE 1 TABLET BY MOUTH EVERY DAY 30 tablet 9  . ferrous sulfate 325 (65 FE) MG tablet Take 1 tablet (325 mg total) by mouth daily. For one month then stop. 30 tablet 3  . lisinopril (PRINIVIL,ZESTRIL) 2.5 MG tablet Take 1 tablet (2.5 mg total) by mouth daily. 30 tablet 1  . metoprolol tartrate (LOPRESSOR) 25 MG tablet Take 0.5 tablets (12.5 mg total) by mouth 2 (two) times daily. 30 tablet 1  . traMADol (ULTRAM) 50 MG tablet Take 50 mg by mouth every 4-6 hours PRN severe pain. 30 tablet 0   No current facility-administered medications for this visit.     Allergies  Allergen Reactions  . Tape Dermatitis    blister    Review of Systems  Improved strength and exercise tolerance No angina No fever Weight stable  BP 108/68 (BP Location: Right Arm, Patient Position: Sitting, Cuff Size: Large)   Pulse 62   Resp 16   Ht 5\' 2"  (1.575 m)   Wt 164 lb (74.4 kg)   SpO2 99% Comment: ON RA  BMI 30.00 kg/m  Physical Exam      Exam    General- alert and comfortable   Lungs- clear without rales, wheezes   Cor- regular rate and rhythm, no murmur ,  gallop   Abdomen- soft, non-tender   Extremities - warm, non-tender, minimal edema   Neuro- oriented, appropriate, no focal weakness   Diagnostic Tests:  Chest x-ray performed today is clear Impression: Excellent early recovery after multivessel CABG Continue current medications We will refer to outpatient cardiac rehab program  Plan: Return in 4 weeks to discuss return to work.  Mikey BussingPeter Van Trigt III, MD [facility] [phone]

## 2017-01-19 ENCOUNTER — Other Ambulatory Visit: Payer: Self-pay

## 2017-01-19 ENCOUNTER — Telehealth: Payer: Self-pay | Admitting: Cardiology

## 2017-01-19 MED ORDER — CLOPIDOGREL BISULFATE 75 MG PO TABS: 75.0000 mg | ORAL_TABLET | Freq: Every day | ORAL | 11 refills | Status: DC

## 2017-01-19 NOTE — Telephone Encounter (Signed)
rx refills sent to pharmacy and pt advised to continue plavix as records do not indicate this has been d/c.

## 2017-01-19 NOTE — Telephone Encounter (Signed)
Patient needs refill on Plavix if she is to continue it, pleade call her .

## 2017-01-25 ENCOUNTER — Telehealth: Payer: Self-pay | Admitting: Cardiology

## 2017-01-25 NOTE — Telephone Encounter (Signed)
S.w pt and advised that she would not need abx prior to dental work.

## 2017-01-25 NOTE — Telephone Encounter (Signed)
Wants to know if she needs to be premedicated for dental work

## 2017-02-10 ENCOUNTER — Other Ambulatory Visit: Payer: Self-pay | Admitting: Physician Assistant

## 2017-02-12 ENCOUNTER — Other Ambulatory Visit: Payer: Self-pay | Admitting: Cardiology

## 2017-02-14 ENCOUNTER — Ambulatory Visit (INDEPENDENT_AMBULATORY_CARE_PROVIDER_SITE_OTHER): Payer: Self-pay | Admitting: Cardiothoracic Surgery

## 2017-02-14 ENCOUNTER — Encounter: Payer: Self-pay | Admitting: Cardiothoracic Surgery

## 2017-02-14 VITALS — BP 130/78 | HR 90 | Resp 20 | Ht 62.0 in | Wt 173.0 lb

## 2017-02-14 DIAGNOSIS — Z951 Presence of aortocoronary bypass graft: Secondary | ICD-10-CM

## 2017-02-14 NOTE — Progress Notes (Signed)
PCP is Wendling, Jilda RocheNicholas Paul, DO Referring Provider is Georgeanna LeaKrasowski, Robert J, MD  Chief Complaint  Patient presents with  . Routine Post Op    4wk follow up, discuss return to work, post op CABG    HPI: Final 2 month postop follow-up after urgent CABG for non-STEMI and three-vessel CAD Patient recovering well but limited by severe degenerative arthritis of both knees Sternal incision well-healed No recurrent angina No ankle edema or symptoms of CHF Plavix will be discontinued after she finishes her current prescription  Past Medical History:  Diagnosis Date  . Asthma   . Cataract    current early stage  . Essential hypertension 12/01/2016  . Osteoarthritis   . Seasonal allergies     Past Surgical History:  Procedure Laterality Date  . CORONARY ARTERY BYPASS GRAFT N/A 12/12/2016   Procedure: CORONARY ARTERY BYPASS GRAFTING (CABG) x four , using left internal mammary artery and right leg greater saphenous vein harvested endoscopically;  Surgeon: Kerin PernaVan Trigt, Aleya Durnell, MD;  [facility];  Service: Open Heart Surgery;  Laterality: N/A;  . LEFT HEART CATH AND CORONARY ANGIOGRAPHY N/A 12/11/2016   Procedure: LEFT HEART CATH AND CORONARY ANGIOGRAPHY;  Surgeon: Yvonne KendallEnd, Christopher, MD;  [facility];  Service: Cardiovascular;  Laterality: N/A;  . screws removed from ankles    . TEE WITHOUT CARDIOVERSION N/A 12/12/2016   Procedure: TRANSESOPHAGEAL ECHOCARDIOGRAM (TEE);  Surgeon: Donata ClayVan Trigt, Theron AristaPeter, MD;  [facility];  Service: Open Heart Surgery;  Laterality: N/A;  . TUBAL LIGATION      Family History  Problem Relation Age of Onset  . Heart disease Mother   . Hypertension Mother   . Hyperlipidemia Mother   . COPD Father   . Hyperlipidemia Father   . Heart attack Brother 42  . Hyperlipidemia Maternal Grandfather     Social History Social History   Tobacco Use  . Smoking status: Never Smoker  . Smokeless tobacco: Never Used  Substance Use Topics  . Alcohol use:  Yes    Comment: occasional  . Drug use: No    Current Outpatient Medications  Medication Sig Dispense Refill  . acetaminophen (TYLENOL) 500 MG tablet Take 500 mg by mouth every 6 (six) hours as needed for headache.    Marland Kitchen. aspirin EC 81 MG tablet Take 1 tablet (81 mg total) by mouth daily. 90 tablet 3  . atorvastatin (LIPITOR) 80 MG tablet TAKE 1 TABLET BY MOUTH DAILY AT 6PM 30 tablet 6  . lisinopril (PRINIVIL,ZESTRIL) 2.5 MG tablet TAKE 1 TABLET BY MOUTH EVERY DAY 30 tablet 6  . metoprolol tartrate (LOPRESSOR) 25 MG tablet Take 0.5 tablets (12.5 mg total) by mouth 2 (two) times daily. 30 tablet 1   No current facility-administered medications for this visit.     Allergies  Allergen Reactions  . Tape Dermatitis    blister    Review of Systems Appetite and sleeping habits improved Incisions clean and dry No ankle edema BP 130/78   Pulse 90   Resp 20   Ht 5\' 2"  (1.575 m)   Wt 173 lb (78.5 kg)   SpO2 99% Comment: RA  BMI 31.64 kg/m  Physical Exam      Exam    General- alert and comfortable   Lungs- clear without rales, wheezes   Cor- regular rate and rhythm, no murmur , gallop   Abdomen- soft, non-tender   Extremities - warm, non-tender, minimal edema   Neuro- oriented, appropriate, no focal weakness  Diagnostic Tests:  No chest x-ray performed today Impression: Excellent early recovery after multivessel CABG for non-ST elevation MI Patient recovery entered by her severe knee arthritis bilaterally Will be able to lift more than 20 pounds after January 1 but will be limited by her knees and back  Plan: Continue aspirin, Lipitor, metoprolol, lisinopril indefinitely Importance of heart healthy diet and activities reviewed with patient Return as needed Mikey BussingPeter Van Trigt III, MD [facility] [phone]

## 2017-02-19 ENCOUNTER — Ambulatory Visit: Payer: Self-pay | Admitting: Cardiology

## 2017-02-21 ENCOUNTER — Encounter: Payer: Self-pay | Admitting: Cardiology

## 2017-02-21 ENCOUNTER — Ambulatory Visit: Payer: Self-pay | Admitting: Cardiology

## 2017-02-21 ENCOUNTER — Ambulatory Visit (INDEPENDENT_AMBULATORY_CARE_PROVIDER_SITE_OTHER): Payer: Self-pay | Admitting: Cardiology

## 2017-02-21 VITALS — BP 110/70 | HR 87 | Ht 62.0 in | Wt 175.0 lb

## 2017-02-21 DIAGNOSIS — I2510 Atherosclerotic heart disease of native coronary artery without angina pectoris: Secondary | ICD-10-CM

## 2017-02-21 DIAGNOSIS — Z951 Presence of aortocoronary bypass graft: Secondary | ICD-10-CM

## 2017-02-21 DIAGNOSIS — I10 Essential (primary) hypertension: Secondary | ICD-10-CM

## 2017-02-21 NOTE — Progress Notes (Signed)
Cardiology Office Note:    Date:  02/21/2017   ID:  Monica NakayamaDonna Elden, DOB 08/26/1960, MRN [id number]  PCP:  Sharlene DoryWendling, Nicholas Paul, DO  Cardiologist:  Gypsy Balsamobert Krasowski, MD    Referring MD: Sharlene DoryWendling, Nicholas Paul*   Chief Complaint  Patient presents with  . Hospitalization Follow-up    After cath  Doing well  History of Present Illness:    Monica Lynch is a 56 y.o. female status post coronary artery bypass graft recovering nicely after surgery this is being already more than a month.  No chest pain tightness squeezing pressure burning chest.  She is fairly active I still offer her to go to rehab she is will investigate that.  It is time to check her cholesterol which I will do  Past Medical History:  Diagnosis Date  . Asthma   . Cataract    current early stage  . Essential hypertension 12/01/2016  . Osteoarthritis   . Seasonal allergies     Past Surgical History:  Procedure Laterality Date  . CORONARY ARTERY BYPASS GRAFT N/A 12/12/2016   Procedure: CORONARY ARTERY BYPASS GRAFTING (CABG) x four , using left internal mammary artery and right leg greater saphenous vein harvested endoscopically;  Surgeon: Kerin PernaVan Trigt, Peter, MD;  [facility];  Service: Open Heart Surgery;  Laterality: N/A;  . LEFT HEART CATH AND CORONARY ANGIOGRAPHY N/A 12/11/2016   Procedure: LEFT HEART CATH AND CORONARY ANGIOGRAPHY;  Surgeon: Yvonne KendallEnd, Christopher, MD;  [facility];  Service: Cardiovascular;  Laterality: N/A;  . screws removed from ankles    . TEE WITHOUT CARDIOVERSION N/A 12/12/2016   Procedure: TRANSESOPHAGEAL ECHOCARDIOGRAM (TEE);  Surgeon: Donata ClayVan Trigt, Theron AristaPeter, MD;  [facility];  Service: Open Heart Surgery;  Laterality: N/A;  . TUBAL LIGATION      Current Medications: Current Meds  Medication Sig  . acetaminophen (TYLENOL) 500 MG tablet Take 500 mg by mouth every 6 (six) hours as needed for headache.  Marland Kitchen. aspirin EC 81 MG tablet Take 1 tablet (81 mg total) by mouth daily.   Marland Kitchen. atorvastatin (LIPITOR) 80 MG tablet TAKE 1 TABLET BY MOUTH DAILY AT 6PM  . clopidogrel (PLAVIX) 75 MG tablet Take 75 mg by mouth daily.  Marland Kitchen. lisinopril (PRINIVIL,ZESTRIL) 2.5 MG tablet TAKE 1 TABLET BY MOUTH EVERY DAY  . metoprolol tartrate (LOPRESSOR) 25 MG tablet Take 0.5 tablets (12.5 mg total) by mouth 2 (two) times daily.     Allergies:   Tape and Codeine   Social History   Socioeconomic History  . Marital status: Married    Spouse name: None  . Number of children: None  . Years of education: None  . Highest education level: None  Social Needs  . Financial resource strain: None  . Food insecurity - worry: None  . Food insecurity - inability: None  . Transportation needs - medical: None  . Transportation needs - non-medical: None  Occupational History  . None  Tobacco Use  . Smoking status: Never Smoker  . Smokeless tobacco: Never Used  Substance and Sexual Activity  . Alcohol use: Yes    Comment: occasional  . Drug use: No  . Sexual activity: None  Other Topics Concern  . None  Social History Narrative  . None     Family History: The patient's family history includes COPD in her father; Heart attack (age of onset: 2942) in her brother; Heart disease in her mother; Hyperlipidemia in her father, maternal grandfather, and mother; Hypertension in her  mother. ROS:   Please see the history of present illness.    All 14 point review of systems negative except as described per history of present illness  EKGs/Labs/Other Studies Reviewed:      Recent Labs: 12/11/2016: ALT 22 12/12/2016: TSH 4.635 12/13/2016: Magnesium 2.0 12/15/2016: BUN 12; Creatinine, Ser 0.61; Hemoglobin 8.5; Platelets 157; Potassium 3.5; Sodium 134  Recent Lipid Panel    Component Value Date/Time   CHOL 213 (H) 12/01/2016 0955   TRIG 129 12/01/2016 0955   HDL 50 12/01/2016 0955   CHOLHDL 4.3 12/01/2016 0955   LDLCALC 137 (H) 12/01/2016 0955    Physical Exam:    VS:  BP 110/70 (BP Location:  Right Arm, Patient Position: Sitting, Cuff Size: Large)   Pulse 87   Ht 5\' 2"  (1.575 m)   Wt 175 lb (79.4 kg)   SpO2 98%   BMI 32.01 kg/m     Wt Readings from Last 3 Encounters:  02/21/17 175 lb (79.4 kg)  02/14/17 173 lb (78.5 kg)  01/17/17 164 lb (74.4 kg)     GEN:  Well nourished, well developed in no acute distress HEENT: Normal NECK: No JVD; No carotid bruits LYMPHATICS: No lymphadenopathy CARDIAC: RRR, no murmurs, no rubs, no gallops RESPIRATORY:  Clear to auscultation without rales, wheezing or rhonchi  ABDOMEN: Soft, non-tender, non-distended MUSCULOSKELETAL:  No edema; No deformity  SKIN: Warm and dry LOWER EXTREMITIES: no swelling NEUROLOGIC:  Alert and oriented x 3 PSYCHIATRIC:  Normal affect   ASSESSMENT:    1. Coronary artery disease involving native coronary artery of native heart without angina pectoris   2. Essential hypertension   3. Status post coronary artery bypass graft: LIMA to LAD, SVG to PDA, SVG to diagonal 1, SVG to obtuse marginal 1 ejection fraction was normal.  Done in October 2018    PLAN:    In order of problems listed above:  1. Coronary artery disease status post coronary artery bypass graft recover completely doing well we will continue present management. 2. Essential hypertension: Blood pressure well controlled continue present management. 3. Dyslipidemia: We will continue high intensity statin and fasting lipid profile will be checked today   Medication Adjustments/Labs and Tests Ordered: Current medicines are reviewed at length with the patient today.  Concerns regarding medicines are outlined above.  No orders of the defined types were placed in this encounter.  Medication changes: No orders of the defined types were placed in this encounter.   Signed, Georgeanna Leaobert J. Krasowski, MD, [facility] 02/21/2017 4:34 PM    [facility]

## 2017-02-21 NOTE — Addendum Note (Signed)
Addended by: Arville CareHUNT, Leda [place] N on: 02/21/2017 04:47 PM   Modules accepted: Orders

## 2017-02-21 NOTE — Patient Instructions (Signed)
Medication Instructions:  Your physician recommends that you continue on your current medications as directed. Please refer to the Current Medication list given to you today.  Labwork: Your physician recommends that you have lab work today: Lipid  Testing/Procedures: None ordered  Follow-Up: Your physician recommends that you schedule a follow-up appointment in: 3 months with Dr. Bing MatterKrasowski   Any Other Special Instructions Will Be Listed Below (If Applicable).     If you need a refill on your cardiac medications before your next appointment, please call your pharmacy.

## 2017-02-22 LAB — LIPID PANEL
CHOL/HDL RATIO: 2.9 ratio (ref 0.0–4.4)
Cholesterol, Total: 115 mg/dL (ref 100–199)
HDL: 39 mg/dL — ABNORMAL LOW (ref >39)
LDL Calculated: 57 mg/dL (ref 0–99)
Triglycerides: 97 mg/dL (ref 0–149)
VLDL Cholesterol Cal: 19 mg/dL (ref 5–40)

## 2017-03-02 ENCOUNTER — Telehealth (HOSPITAL_COMMUNITY): Payer: Self-pay

## 2017-03-02 NOTE — Telephone Encounter (Signed)
Attempted to call patient in regards to Insurance for Cardiac Rehab - Lm on Vm. °

## 2017-03-21 ENCOUNTER — Telehealth: Payer: Self-pay | Admitting: *Deleted

## 2017-03-21 NOTE — Telephone Encounter (Signed)
Received request for Medical records from [employer], forwarded to [place] for email/scan/SLS 01/09     

## 2017-04-03 DIAGNOSIS — Z736 Limitation of activities due to disability: Secondary | ICD-10-CM

## 2017-05-14 ENCOUNTER — Other Ambulatory Visit: Payer: Self-pay | Admitting: Cardiology

## 2017-05-22 ENCOUNTER — Encounter: Payer: Self-pay | Admitting: Cardiology

## 2017-05-22 ENCOUNTER — Ambulatory Visit (INDEPENDENT_AMBULATORY_CARE_PROVIDER_SITE_OTHER): Payer: Self-pay | Admitting: Cardiology

## 2017-05-22 VITALS — BP 160/72 | HR 74 | Ht 62.0 in | Wt 187.0 lb

## 2017-05-22 DIAGNOSIS — I2510 Atherosclerotic heart disease of native coronary artery without angina pectoris: Secondary | ICD-10-CM

## 2017-05-22 DIAGNOSIS — I10 Essential (primary) hypertension: Secondary | ICD-10-CM

## 2017-05-22 DIAGNOSIS — E785 Hyperlipidemia, unspecified: Secondary | ICD-10-CM

## 2017-05-22 DIAGNOSIS — R9439 Abnormal result of other cardiovascular function study: Secondary | ICD-10-CM

## 2017-05-22 DIAGNOSIS — Z951 Presence of aortocoronary bypass graft: Secondary | ICD-10-CM

## 2017-05-22 NOTE — Progress Notes (Signed)
Cardiology Office Note:    Date:  05/22/2017   ID:  Monica Lynch Andersson, DOB 08/28/1960, MRN [id number]  PCP:  Sharlene DoryWendling, Nicholas Paul, DO  Cardiologist:  Gypsy Balsamobert Bharath Bernstein, MD    Referring MD: Sharlene DoryWendling, Nicholas Paul*   Chief Complaint  Patient presents with  . Follow-up  Doing well  History of Present Illness:    Monica Lynch Ivins is a 57 y.o. female with coronary artery disease, status post coronary artery bypass grafting October 2018.  Doing well denies have any chest pain tightness squeezing pressure burning chest.  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  Her blood pressure appears to be elevated today.  I will check a Chem-7 today if Chem-7 is acceptable we will double the dose of lisinopril.  Last cholesterol review with the patient is excellent we will continue present management with high intensity statin.  Past Medical History:  Diagnosis Date  . Asthma   . Cataract    current early stage  . Essential hypertension 12/01/2016  . Osteoarthritis   . Seasonal allergies     Past Surgical History:  Procedure Laterality Date  . CORONARY ARTERY BYPASS GRAFT N/A 12/12/2016   Procedure: CORONARY ARTERY BYPASS GRAFTING (CABG) x four , using left internal mammary artery and right leg greater saphenous vein harvested endoscopically;  Surgeon: Kerin PernaVan Trigt, Peter, MD;  [facility];  Service: Open Heart Surgery;  Laterality: N/A;  . LEFT HEART CATH AND CORONARY ANGIOGRAPHY N/A 12/11/2016   Procedure: LEFT HEART CATH AND CORONARY ANGIOGRAPHY;  Surgeon: Yvonne KendallEnd, Christopher, MD;  [facility];  Service: Cardiovascular;  Laterality: N/A;  . screws removed from ankles    . TEE WITHOUT CARDIOVERSION N/A 12/12/2016   Procedure: TRANSESOPHAGEAL ECHOCARDIOGRAM (TEE);  Surgeon: Donata ClayVan Trigt, Theron AristaPeter, MD;  [facility];  Service: Open Heart Surgery;  Laterality: N/A;  . TUBAL LIGATION      Current Medications: Current Meds  Medication Sig  . acetaminophen (TYLENOL)  500 MG tablet Take 500 mg by mouth every 6 (six) hours as needed for headache.  Marland Kitchen. aspirin EC 81 MG tablet Take 1 tablet (81 mg total) by mouth daily.  Marland Kitchen. atorvastatin (LIPITOR) 80 MG tablet TAKE 1 TABLET BY MOUTH DAILY AT 6PM  . lisinopril (PRINIVIL,ZESTRIL) 2.5 MG tablet TAKE 1 TABLET BY MOUTH EVERY DAY  . metoprolol tartrate (LOPRESSOR) 25 MG tablet Take 0.5 tablets (12.5 mg total) by mouth 2 (two) times daily.     Allergies:   Tape and Codeine   Social History   Socioeconomic History  . Marital status: Married    Spouse name: None  . Number of children: None  . Years of education: None  . Highest education level: None  Social Needs  . Financial resource strain: None  . Food insecurity - worry: None  . Food insecurity - inability: None  . Transportation needs - medical: None  . Transportation needs - non-medical: None  Occupational History  . None  Tobacco Use  . Smoking status: Never Smoker  . Smokeless tobacco: Never Used  Substance and Sexual Activity  . Alcohol use: Yes    Comment: occasional  . Drug use: No  . Sexual activity: None  Other Topics Concern  . None  Social History Narrative  . None     Family History: The patient's family history includes COPD in her father; Heart attack (age of onset: 7342) in her brother; Heart disease in her mother; Hyperlipidemia in her father, maternal  grandfather, and mother; Hypertension in her mother. ROS:   Please see the history of present illness.    All 14 point review of systems negative except as described per history of present illness  EKGs/Labs/Other Studies Reviewed:      Recent Labs: 12/11/2016: ALT 22 12/12/2016: TSH 4.635 12/13/2016: Magnesium 2.0 12/15/2016: BUN 12; Creatinine, Ser 0.61; Hemoglobin 8.5; Platelets 157; Potassium 3.5; Sodium 134  Recent Lipid Panel    Component Value Date/Time   CHOL 115 02/21/2017 1652   TRIG 97 02/21/2017 1652   HDL 39 (L) 02/21/2017 1652   CHOLHDL 2.9 02/21/2017 1652    LDLCALC 57 02/21/2017 1652    Physical Exam:    VS:  BP (!) 160/72   Pulse 74   Ht 5\' 2"  (1.575 m)   Wt 187 lb (84.8 kg)   SpO2 98%   BMI 34.20 kg/m     Wt Readings from Last 3 Encounters:  05/22/17 187 lb (84.8 kg)  02/21/17 175 lb (79.4 kg)  02/14/17 173 lb (78.5 kg)     GEN:  Well nourished, well developed in no acute distress HEENT: Normal NECK: No JVD; No carotid bruits LYMPHATICS: No lymphadenopathy CARDIAC: RRR, no murmurs, no rubs, no gallops RESPIRATORY:  Clear to auscultation without rales, wheezing or rhonchi  ABDOMEN: Soft, non-tender, non-distended MUSCULOSKELETAL:  No edema; No deformity  SKIN: Warm and dry LOWER EXTREMITIES: no swelling NEUROLOGIC:  Alert and oriented x 3 PSYCHIATRIC:  Normal affect   ASSESSMENT:    1. Status post coronary artery bypass graft: LIMA to LAD, SVG to PDA, SVG to diagonal 1, SVG to obtuse marginal 1 ejection fraction was normal.  Done in October 2018   2. Dyslipidemia   3. Essential hypertension    PLAN:    In order of problems listed above:  1. Status post coronary artery bypass graft: Doing well from that point review continue present management. 2. Dyslipidemia continue high intensity statin. 3. Essential hypertension: Blood pressure elevated today.  I will check Chem-7 if Chem-7 is fine we will double the dose of lisinopril.  I bring her back to my office in about 3 months from today or sooner if she has a problem basically to check her blood pressure.  She will bring her blood pressure monitor to me so we can check accuracy of her device.  Chem-7 will be repeated week after dose of lisinopril will be increased.   Medication Adjustments/Labs and Tests Ordered: Current medicines are reviewed at length with the patient today.  Concerns regarding medicines are outlined above.  No orders of the defined types were placed in this encounter.  Medication changes: No orders of the defined types were placed in this  encounter.   Signed, Georgeanna Lea, MD, [facility] 05/22/2017 8:47 AM    [facility]

## 2017-05-22 NOTE — Patient Instructions (Signed)
Medication Instructions:  Your physician recommends that you continue on your current medications as directed. Please refer to the Current Medication list given to you today.   Labwork: Your physician recommends that you return for lab work today: BMP.  Testing/Procedures: None.  Follow-Up: Your physician wants you to follow-up in: 3 months. You will receive a reminder letter in the mail two months in advance. If you don't receive a letter, please call our office to schedule the follow-up appointment.   Any Other Special Instructions Will Be Listed Below (If Applicable).     If you need a refill on your cardiac medications before your next appointment, please call your pharmacy.   

## 2017-05-23 ENCOUNTER — Telehealth: Payer: Self-pay | Admitting: Cardiology

## 2017-05-23 LAB — BASIC METABOLIC PANEL
BUN/Creatinine Ratio: 15 (ref 9–23)
BUN: 10 mg/dL (ref 6–24)
CALCIUM: 9.6 mg/dL (ref 8.7–10.2)
CHLORIDE: 102 mmol/L (ref 96–106)
CO2: 24 mmol/L (ref 20–29)
Creatinine, Ser: 0.67 mg/dL (ref 0.57–1.00)
GFR calc Af Amer: 114 mL/min/{1.73_m2} (ref >59)
GFR calc non Af Amer: 99 mL/min/{1.73_m2} (ref >59)
Glucose: 103 mg/dL — ABNORMAL HIGH (ref 65–99)
POTASSIUM: 4.4 mmol/L (ref 3.5–5.2)
Sodium: 143 mmol/L (ref 134–144)

## 2017-05-23 MED ORDER — LISINOPRIL 5 MG PO TABS: 5.0000 mg | ORAL_TABLET | Freq: Every day | ORAL | 3 refills | Status: DC

## 2017-05-23 NOTE — Telephone Encounter (Signed)
Patient informed of results. Advised patient to increase lisinopril to 5 mg daily. Refill sent to [facility]. Patient verbalized understanding. No further questions.

## 2017-05-23 NOTE — Telephone Encounter (Signed)
Patient returning your call.

## 2017-08-10 DIAGNOSIS — Z736 Limitation of activities due to disability: Secondary | ICD-10-CM

## 2017-09-06 ENCOUNTER — Other Ambulatory Visit: Payer: Self-pay | Admitting: *Deleted

## 2017-09-06 MED ORDER — ATORVASTATIN CALCIUM 80 MG PO TABS: ORAL_TABLET | ORAL | 9 refills | Status: DC

## 2017-09-06 NOTE — Telephone Encounter (Signed)
Refill for lipitor sent to [facility].

## 2017-11-19 ENCOUNTER — Ambulatory Visit (INDEPENDENT_AMBULATORY_CARE_PROVIDER_SITE_OTHER): Payer: Self-pay | Admitting: Cardiology

## 2017-11-19 ENCOUNTER — Encounter: Payer: Self-pay | Admitting: Cardiology

## 2017-11-19 VITALS — BP 138/74 | HR 67 | Resp 16 | Ht 62.0 in | Wt 194.0 lb

## 2017-11-19 DIAGNOSIS — Z951 Presence of aortocoronary bypass graft: Secondary | ICD-10-CM

## 2017-11-19 DIAGNOSIS — I10 Essential (primary) hypertension: Secondary | ICD-10-CM

## 2017-11-19 DIAGNOSIS — E785 Hyperlipidemia, unspecified: Secondary | ICD-10-CM

## 2017-11-19 DIAGNOSIS — I2510 Atherosclerotic heart disease of native coronary artery without angina pectoris: Secondary | ICD-10-CM

## 2017-11-19 NOTE — Patient Instructions (Signed)
Medication Instructions:  Your physician recommends that you continue on your current medications as directed. Please refer to the Current Medication list given to you today.   Labwork: Your physician recommends that you return for lab work in: 1 week: Lipids. Please fast before.   Testing/Procedures: None.  Follow-Up: Your physician wants you to follow-up in: 6 months. You will receive a reminder letter in the mail two months in advance. If you don't receive a letter, please call our office to schedule the follow-up appointment.   Any Other Special Instructions Will Be Listed Below (If Applicable).     If you need a refill on your cardiac medications before your next appointment, please call your pharmacy.

## 2017-11-19 NOTE — Progress Notes (Signed)
Cardiology Office Note:    Date:  11/19/2017   ID:  Monica Lynch, DOB [birth year], MRN [id number]  PCP:  Sharlene Dory, DO  Cardiologist:  Gypsy Balsam, MD    Referring MD: Sharlene Dory*   No chief complaint on file. Doing well  History of Present Illness:    Monica Lynch is a 57 y.o. female with coronary artery disease status post coronary artery bypass grafting October 2018.  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 Asthma   . Cataract    current early stage  . Essential hypertension 12/01/2016  . Osteoarthritis   . Primary osteoarthritis of right knee 11/07/2013  . Seasonal allergies     Past Surgical History:  Procedure Laterality Date  . CORONARY ARTERY BYPASS GRAFT N/A 12/12/2016   Procedure: CORONARY ARTERY BYPASS GRAFTING (CABG) x four , using left internal mammary artery and right leg greater saphenous vein harvested endoscopically;  Surgeon: Kerin Perna, MD;  Location: [facility] OR;  Service: Open Heart Surgery;  Laterality: N/A;  . LEFT HEART CATH AND CORONARY ANGIOGRAPHY N/A 12/11/2016   Procedure: LEFT HEART CATH AND CORONARY ANGIOGRAPHY;  Surgeon: Yvonne Kendall, MD;  Location: [facility];  Service: Cardiovascular;  Laterality: N/A;  . screws removed from ankles    . TEE WITHOUT CARDIOVERSION N/A 12/12/2016   Procedure: TRANSESOPHAGEAL ECHOCARDIOGRAM (TEE);  Surgeon: Donata Clay, Theron Arista, MD;  Location: [facility] OR;  Service: Open Heart Surgery;  Laterality: N/A;  . TUBAL LIGATION      Current Medications: Current Meds  Medication Sig  . acetaminophen (TYLENOL) 500 MG tablet Take 500 mg by mouth every 6 (six) hours as needed for headache.  Marland Kitchen aspirin EC 81 MG tablet Take 1 tablet (81 mg total) by mouth daily.  Marland Kitchen atorvastatin  (LIPITOR) 80 MG tablet TAKE 1 TABLET BY MOUTH DAILY AT 6PM  . lisinopril (PRINIVIL,ZESTRIL) 5 MG tablet Take 1 tablet (5 mg total) by mouth daily.  . metoprolol tartrate (LOPRESSOR) 25 MG tablet Take 0.5 tablets (12.5 mg total) by mouth 2 (two) times daily.     Allergies:   Tape and Codeine   Social History   Socioeconomic History  . Marital status: Married    Spouse name: Not on file  . Number of children: Not on file  . Years of education: Not on file  . Highest education level: Not on file  Occupational History  . Not on file  Social Needs  . Financial resource strain: Not on file  . Food insecurity:    Worry: Not on file    Inability: Not on file  . Transportation needs:    Medical: Not on file    Non-medical: Not on file  Tobacco Use  . Smoking status: Never Smoker  . Smokeless tobacco: Never Used  Substance and Sexual Activity  . Alcohol use: Yes    Comment: occasional  . Drug use: No  . Sexual activity: Not on file  Lifestyle  . Physical activity:    Days per week: Not on file    Minutes per session: Not on file  . Stress: Not on file  Relationships  . Social connections:    Talks on phone: Not on file    Gets  together: Not on file    Attends religious service: Not on file    Active member of club or organization: Not on file    Attends meetings of clubs or organizations: Not on file    Relationship status: Not on file  Other Topics Concern  . Not on file  Social History Narrative  . Not on file     Family History: The patient's family history includes COPD in her father; Heart attack (age of onset: 52) in her brother; Heart disease in her mother; Hyperlipidemia in her father, maternal grandfather, and mother; Hypertension in her mother. ROS:   Please see the history of present illness.    All 14 point review of systems negative except as described per history of present illness  EKGs/Labs/Other Studies Reviewed:      Recent Labs: 12/11/2016: ALT  22 12/12/2016: TSH 4.635 12/13/2016: Magnesium 2.0 12/15/2016: Hemoglobin 8.5; Platelets 157 05/22/2017: BUN 10; Creatinine, Ser 0.67; Potassium 4.4; Sodium 143  Recent Lipid Panel    Component Value Date/Time   CHOL 115 02/21/2017 1652   TRIG 97 02/21/2017 1652   HDL 39 (L) 02/21/2017 1652   CHOLHDL 2.9 02/21/2017 1652   LDLCALC 57 02/21/2017 1652    Physical Exam:    VS:  BP 138/74 (BP Location: Right Arm, Patient Position: Sitting)   Pulse 67   Resp 16   Ht 5\' 2"  (1.575 m)   Wt 194 lb (88 kg)   SpO2 98%   BMI 35.48 kg/m     Wt Readings from Last 3 Encounters:  11/19/17 194 lb (88 kg)  05/22/17 187 lb (84.8 kg)  02/21/17 175 lb (79.4 kg)     GEN:  Well nourished, well developed in no acute distress HEENT: Normal NECK: No JVD; No carotid bruits LYMPHATICS: No lymphadenopathy CARDIAC: RRR, no murmurs, no rubs, no gallops RESPIRATORY:  Clear to auscultation without rales, wheezing or rhonchi  ABDOMEN: Soft, non-tender, non-distended MUSCULOSKELETAL:  No edema; No deformity  SKIN: Warm and dry LOWER EXTREMITIES: no swelling NEUROLOGIC:  Alert and oriented x 3 PSYCHIATRIC:  Normal affect   ASSESSMENT:    1. Status post coronary artery bypass graft: LIMA to LAD, SVG to PDA, SVG to diagonal 1, SVG to obtuse marginal 1 ejection fraction was normal.  Done in October 2018   2. Coronary artery disease involving native coronary artery of native heart without angina pectoris   3. Essential hypertension   4. Dyslipidemia    PLAN:    In order of problems listed above:  1. Status post coronary artery bypass graft wound healed completely doing well asymptomatic will continue modification of his risk factors for coronary artery disease 2. Essential hypertension she check her blood pressure at home is almost 130/70 good continue present management. 3. Dyslipidemia we will schedule her to have fasting lipid profile done.  Overall she is doing well I see her back in my office in  6 months   Medication Adjustments/Labs and Tests Ordered: Current medicines are reviewed at length with the patient today.  Concerns regarding medicines are outlined above.  No orders of the defined types were placed in this encounter.  Medication changes: No orders of the defined types were placed in this encounter.   Signed, Georgeanna Lea, MD, [facility], [facility] 11/19/2017 1:58 PM    [facility]

## 2017-11-19 NOTE — Addendum Note (Signed)
Addended by: Lita Mains on: 11/19/2017 02:07 PM   Modules accepted: Orders

## 2017-12-07 LAB — LIPID PANEL
CHOLESTEROL TOTAL: 125 mg/dL (ref 100–199)
Chol/HDL Ratio: 3.1 ratio (ref 0.0–4.4)
HDL: 40 mg/dL (ref >39)
LDL Calculated: 64 mg/dL (ref 0–99)
TRIGLYCERIDES: 104 mg/dL (ref 0–149)
VLDL Cholesterol Cal: 21 mg/dL (ref 5–40)

## 2018-05-20 ENCOUNTER — Other Ambulatory Visit: Payer: Self-pay | Admitting: Cardiology

## 2018-06-10 ENCOUNTER — Other Ambulatory Visit: Payer: Self-pay | Admitting: Cardiology

## 2018-09-17 ENCOUNTER — Other Ambulatory Visit: Payer: Self-pay | Admitting: Cardiology

## 2018-10-01 ENCOUNTER — Other Ambulatory Visit: Payer: Self-pay | Admitting: Cardiology

## 2018-10-01 NOTE — Telephone Encounter (Signed)
Spoke with pt to clarify the message: are refills for pt or her husband. She said it was for her medications and that she had just spoke with her pharmacy who said they could get her refills transferred to [place]. She will let us know if she needs anything else.

## 2018-10-01 NOTE — Telephone Encounter (Signed)
Rx refill sent to pharmacy. 

## 2018-10-01 NOTE — Telephone Encounter (Signed)
Patient is on vacation and left her meds at home and also her husband has been admitted to the hospital in [place] and not sure how long she will be there, can we please call in all his meds to  Pharmacy in [place]... [facility] [phone] Telephone  [place] is address.Marland KitchenMarland Kitchen

## 2018-12-31 ENCOUNTER — Other Ambulatory Visit: Payer: Self-pay | Admitting: Cardiology

## 2018-12-31 NOTE — Telephone Encounter (Signed)
Atorvastatin refill sent to [facility]

## 2019-02-13 ENCOUNTER — Telehealth: Payer: Self-pay | Admitting: Family Medicine

## 2019-02-13 NOTE — Telephone Encounter (Signed)
Pt called stating her husband just passed. Pt is requesting to have a medication sent in to help her get through the funeral process. Please advise.    [place], [place] - [address]  [place] [address]  Phone: [phone] Fax: [phone]  Not a 24 hour pharmacy; exact hours not known.

## 2019-02-14 NOTE — Telephone Encounter (Signed)
Sched appt to discuss plz. Ty.  

## 2019-02-14 NOTE — Telephone Encounter (Signed)
Pt called back again to follow up on request for a rx to help her as her husband's funeral is tomorrow. Please advise and follow up with pt.  [place], [place] - [address]  [place] [address]  Phone: [phone] Fax: [phone]  Not a 24 hour pharmacy; exact hours not known.

## 2019-02-14 NOTE — Telephone Encounter (Signed)
Can you schedule patient

## 2019-02-17 NOTE — Telephone Encounter (Signed)
Pt states she got thru the weekend ok and doesn't feel that she will need any medication right now. She will call if anything changes.

## 2019-05-30 ENCOUNTER — Ambulatory Visit: Payer: Self-pay | Attending: Internal Medicine

## 2019-05-30 DIAGNOSIS — Z23 Encounter for immunization: Secondary | ICD-10-CM

## 2019-05-30 NOTE — Progress Notes (Signed)
   Covid-19 Vaccination Clinic  Name:  Monica Lynch    MRN: [id number] DOB: [birth year]  05/30/2019  [name] was observed post Covid-19 immunization for 15 minutes without incident. She was provided with Vaccine Information Sheet and instruction to access the V-Safe system.   [name] was instructed to call 911 with any severe reactions post vaccine: Marland Kitchen Difficulty breathing  . Swelling of face and throat  . A fast heartbeat  . A bad rash all over body  . Dizziness and weakness   Immunizations Administered    Name Date Dose VIS Date Route   Pfizer COVID-19 Vaccine 05/30/2019  2:51 PM 0.3 mL 02/21/2019 Intramuscular   Manufacturer: [employer], [employer]   Lot: [id number]   NDC: [address]-1

## 2019-06-22 ENCOUNTER — Other Ambulatory Visit: Payer: Self-pay

## 2019-06-22 ENCOUNTER — Emergency Department (HOSPITAL_BASED_OUTPATIENT_CLINIC_OR_DEPARTMENT_OTHER): Payer: Self-pay

## 2019-06-22 ENCOUNTER — Emergency Department (HOSPITAL_BASED_OUTPATIENT_CLINIC_OR_DEPARTMENT_OTHER)
Admission: EM | Admit: 2019-06-22 | Discharge: 2019-06-22 | Disposition: A | Discharge status: Home / Self Care | Payer: Self-pay | Attending: Emergency Medicine | Admitting: Emergency Medicine

## 2019-06-22 ENCOUNTER — Encounter (HOSPITAL_BASED_OUTPATIENT_CLINIC_OR_DEPARTMENT_OTHER): Payer: Self-pay | Admitting: *Deleted

## 2019-06-22 DIAGNOSIS — S8265XA Nondisplaced fracture of lateral malleolus of left fibula, initial encounter for closed fracture: Secondary | ICD-10-CM | POA: Insufficient documentation

## 2019-06-22 DIAGNOSIS — M25561 Pain in right knee: Secondary | ICD-10-CM | POA: Insufficient documentation

## 2019-06-22 DIAGNOSIS — Y929 Unspecified place or not applicable: Secondary | ICD-10-CM | POA: Insufficient documentation

## 2019-06-22 DIAGNOSIS — Y999 Unspecified external cause status: Secondary | ICD-10-CM | POA: Insufficient documentation

## 2019-06-22 DIAGNOSIS — W11XXXA Fall on and from ladder, initial encounter: Secondary | ICD-10-CM | POA: Insufficient documentation

## 2019-06-22 DIAGNOSIS — E785 Hyperlipidemia, unspecified: Secondary | ICD-10-CM | POA: Insufficient documentation

## 2019-06-22 DIAGNOSIS — Z951 Presence of aortocoronary bypass graft: Secondary | ICD-10-CM | POA: Insufficient documentation

## 2019-06-22 DIAGNOSIS — W19XXXA Unspecified fall, initial encounter: Secondary | ICD-10-CM

## 2019-06-22 DIAGNOSIS — Z79899 Other long term (current) drug therapy: Secondary | ICD-10-CM | POA: Insufficient documentation

## 2019-06-22 DIAGNOSIS — S82892A Other fracture of left lower leg, initial encounter for closed fracture: Secondary | ICD-10-CM

## 2019-06-22 DIAGNOSIS — I2510 Atherosclerotic heart disease of native coronary artery without angina pectoris: Secondary | ICD-10-CM | POA: Insufficient documentation

## 2019-06-22 DIAGNOSIS — Z885 Allergy status to narcotic agent status: Secondary | ICD-10-CM | POA: Insufficient documentation

## 2019-06-22 DIAGNOSIS — I10 Essential (primary) hypertension: Secondary | ICD-10-CM | POA: Insufficient documentation

## 2019-06-22 DIAGNOSIS — Z7982 Long term (current) use of aspirin: Secondary | ICD-10-CM | POA: Insufficient documentation

## 2019-06-22 DIAGNOSIS — Y9389 Activity, other specified: Secondary | ICD-10-CM | POA: Insufficient documentation

## 2019-06-22 MED ORDER — KETOROLAC TROMETHAMINE 15 MG/ML IJ SOLN
15.0000 mg | Freq: Once | INTRAMUSCULAR | Status: AC
  Administered 2019-06-22: 15 mg via INTRAMUSCULAR
  Filled 2019-06-22: qty 1

## 2019-06-22 MED ORDER — OXYCODONE HCL 5 MG PO TABS
5.0000 mg | ORAL_TABLET | Freq: Once | ORAL | Status: AC
  Administered 2019-06-22: 5 mg via ORAL
  Filled 2019-06-22: qty 1

## 2019-06-22 MED ORDER — ACETAMINOPHEN 500 MG PO TABS
1000.0000 mg | ORAL_TABLET | Freq: Once | ORAL | Status: AC
  Administered 2019-06-22: 15:00:00 1000 mg via ORAL
  Filled 2019-06-22: qty 2

## 2019-06-22 MED ORDER — MORPHINE SULFATE 15 MG PO TABS: 7.5000 mg | ORAL_TABLET | ORAL | 0 refills | Status: AC | PRN

## 2019-06-22 NOTE — ED Provider Notes (Signed)
[facility] EMERGENCY DEPARTMENT Provider Note   CSN: [id number] Arrival date & time: 06/22/19  1346     History Chief Complaint  Patient presents with  . Fall    Monica Lynch is a 59 y.o. female.  59 yo F with a chief complaints of a fall off of a ladder.  The patient thinks she fell about 3 or 4 steps and she landed on her left ankle.  Ended up having pain to the left ankle and the right knee.  She denies head injury denies loss consciousness denies neck pain back pain chest pain abdominal pain.  The history is provided by the patient.  Fall This is a new problem. The current episode started less than 1 hour ago. The problem occurs constantly. The problem has not changed since onset.Pertinent negatives include no chest pain, no abdominal pain, no headaches and no shortness of breath. The symptoms are aggravated by bending, twisting and walking. Nothing relieves the symptoms. She has tried nothing for the symptoms. The treatment provided no relief.       Past Medical History:  Diagnosis Date  . Asthma   . Cataract    current early stage  . Essential hypertension 12/01/2016  . Osteoarthritis   . Primary osteoarthritis of right knee 11/07/2013  . Seasonal allergies     Patient Active Problem List   Diagnosis Date Noted  . Status post coronary artery bypass graft: LIMA to LAD, SVG to PDA, SVG to diagonal 1, SVG to obtuse marginal 1 ejection fraction was normal.  Done in October 2018 01/05/2017  . Dyslipidemia 01/05/2017  . Coronary artery disease 12/12/2016  . Unstable angina ([place]) 12/09/2016  . Abnormal stress test 12/08/2016  . Atypical chest pain 12/01/2016  . Essential hypertension 12/01/2016  . Dyspnea on exertion 12/01/2016  . Primary osteoarthritis of right knee 11/07/2013    Past Surgical History:  Procedure Laterality Date  . CORONARY ARTERY BYPASS GRAFT N/A 12/12/2016   Procedure: CORONARY ARTERY BYPASS GRAFTING (CABG) x four , using left  internal mammary artery and right leg greater saphenous vein harvested endoscopically;  Surgeon: Ivin Poot, MD;  Location: [place];  Service: Open Heart Surgery;  Laterality: N/A;  . LEFT HEART CATH AND CORONARY ANGIOGRAPHY N/A 12/11/2016   Procedure: LEFT HEART CATH AND CORONARY ANGIOGRAPHY;  Surgeon: Nelva Bush, MD;  Location: [place] CV LAB;  Service: Cardiovascular;  Laterality: N/A;  . screws removed from ankles    . TEE WITHOUT CARDIOVERSION N/A 12/12/2016   Procedure: TRANSESOPHAGEAL ECHOCARDIOGRAM (TEE);  Surgeon: Prescott Gum, Collier Salina, MD;  Location: [place];  Service: Open Heart Surgery;  Laterality: N/A;  . TUBAL LIGATION       OB History   No obstetric history on file.     Family History  Problem Relation Age of Onset  . Heart disease Mother   . Hypertension Mother   . Hyperlipidemia Mother   . COPD Father   . Hyperlipidemia Father   . Heart attack Brother 36  . Hyperlipidemia Maternal Grandfather     Social History   Tobacco Use  . Smoking status: Never Smoker  . Smokeless tobacco: Never Used  Substance Use Topics  . Alcohol use: Yes    Comment: occasional  . Drug use: No    Home Medications Prior to Admission medications   Medication Sig Start Date End Date Taking? Authorizing Provider  aspirin EC 81 MG tablet Take 1 tablet (81 mg total) by mouth daily. 12/01/16  Yes Georgeanna Lea, MD  atorvastatin (LIPITOR) 80 MG tablet TAKE 1 TABLET BY MOUTH DAILY AT 6PM FOR CHOLESTEROL 12/31/18  Yes Georgeanna Lea, MD  lisinopril (ZESTRIL) 5 MG tablet TAKE 1 TABLET BY MOUTH EVERY DAY 10/01/18  Yes Georgeanna Lea, MD  metoprolol tartrate (LOPRESSOR) 25 MG tablet TAKE ONE TABLET BY MOUTH TWICE A DAY 10/01/18  Yes Georgeanna Lea, MD  acetaminophen (TYLENOL) 500 MG tablet Take 500 mg by mouth every 6 (six) hours as needed for headache.    [provider]  morphine (MSIR) 15 MG tablet Take 0.5 tablets (7.5 mg total) by mouth every 4 (four) hours  as needed for severe pain. 06/22/19   Melene Plan, DO    Allergies    Tape and Codeine  Review of Systems   Review of Systems  Constitutional: Negative for chills and fever.  HENT: Negative for congestion and rhinorrhea.   Eyes: Negative for redness and visual disturbance.  Respiratory: Negative for shortness of breath and wheezing.   Cardiovascular: Negative for chest pain and palpitations.  Gastrointestinal: Negative for abdominal pain, nausea and vomiting.  Genitourinary: Negative for dysuria and urgency.  Musculoskeletal: Positive for arthralgias and myalgias.  Skin: Negative for pallor and wound.  Neurological: Negative for dizziness and headaches.    Physical Exam Updated Vital Signs BP (!) 159/74 (BP Location: Left Arm)   Pulse 68   Temp 99.1 F (37.3 C) (Oral)   Resp 18   Ht 5\' 2"  (1.575 m)   Wt 88 kg   SpO2 100%   BMI 35.48 kg/m   Physical Exam Vitals and nursing note reviewed.  Constitutional:      General: She is not in acute distress.    Appearance: She is well-developed. She is not diaphoretic.  HENT:     Head: Normocephalic and atraumatic.  Eyes:     Pupils: Pupils are equal, round, and reactive to light.  Cardiovascular:     Rate and Rhythm: Normal rate and regular rhythm.     Heart sounds: No murmur. No friction rub. No gallop.   Pulmonary:     Effort: Pulmonary effort is normal.     Breath sounds: No wheezing or rales.  Abdominal:     General: There is no distension.     Palpations: Abdomen is soft.     Tenderness: There is no abdominal tenderness.  Musculoskeletal:        General: Swelling and tenderness present.     Cervical back: Normal range of motion and neck supple.     Comments: Pain and swelling to the left ankle worse at the lateral malleolus.  Pulse motor and sensation are intact distally.  There is no pain to the base of the fifth metatarsal or the navicular.  No pain at the fibular neck.  Full range of motion of the left knee without  pain.  Pain and some mild edema to the right knee.  No obvious ligamentous laxity.  Skin:    General: Skin is warm and dry.  Neurological:     Mental Status: She is alert and oriented to person, place, and time.  Psychiatric:        Behavior: Behavior normal.     ED Results / Procedures / Treatments   Labs (all labs ordered are listed, but only abnormal results are displayed) Labs Reviewed - No data to display  EKG None  Radiology No results found.  Procedures Procedures (including critical care time)  Medications Ordered in ED Medications  acetaminophen (TYLENOL) tablet 1,000 mg (1,000 mg Oral Given 06/22/19 1446)  ketorolac (TORADOL) 15 MG/ML injection 15 mg (15 mg Intramuscular Given 06/22/19 1448)  oxyCODONE (Oxy IR/ROXICODONE) immediate release tablet 5 mg (5 mg Oral Given 06/22/19 1447)    ED Course  I have reviewed the triage vital signs and the nursing notes.  Pertinent labs & imaging results that were available during my care of the patient were reviewed by me and considered in my medical decision making (see chart for details).    MDM Rules/Calculators/A&P                      59 yo F with a chief complaints of left ankle and right knee pain after a fall off of a ladder.  Larey Seat about 3 or 4 steps.  Denies other injury.  Obtain plain film.  Treat her pain here.  Reassess.  Weber a fracture as viewed by me.  Will place in a removable splint.  Have her follow-up with sports medicine.  3:04 PM:  I have discussed the diagnosis/risks/treatment options with the patient and family and believe the pt to be eligible for discharge home to follow-up with Sports med, PCP. We also discussed returning to the ED immediately if new or worsening sx occur. We discussed the sx which are most concerning (e.g., sudden worsening pain, fever, inability to tolerate by mouth) that necessitate immediate return. Medications administered to the patient during their visit and any new  prescriptions provided to the patient are listed below.  Medications given during this visit Medications  acetaminophen (TYLENOL) tablet 1,000 mg (1,000 mg Oral Given 06/22/19 1446)  ketorolac (TORADOL) 15 MG/ML injection 15 mg (15 mg Intramuscular Given 06/22/19 1448)  oxyCODONE (Oxy IR/ROXICODONE) immediate release tablet 5 mg (5 mg Oral Given 06/22/19 1447)     The patient appears reasonably screen and/or stabilized for discharge and I doubt any other medical condition or other [facility] requiring further screening, evaluation, or treatment in the ED at this time prior to discharge.   Final Clinical Impression(s) / ED Diagnoses Final diagnoses:  Fall, initial encounter  Acute pain of right knee  Closed fracture of left ankle, initial encounter    Rx / DC Orders ED Discharge Orders         Ordered    morphine (MSIR) 15 MG tablet  Every 4 hours PRN     06/22/19 1503           Melene Plan, DO 06/22/19 1504

## 2019-06-22 NOTE — Discharge Instructions (Addendum)

## 2019-06-22 NOTE — ED Triage Notes (Signed)
Pt states she was 3-4 steps up a 36ft ladder while bolting a gazebo beam. States she lost her footing and everything came crashing down. C/o pain in right knee and left ankle. Obvious swelling noted to ankle. Ice applied. Denies hitting her head or LOC

## 2019-06-24 ENCOUNTER — Ambulatory Visit: Payer: Self-pay | Attending: Internal Medicine

## 2019-06-24 DIAGNOSIS — Z23 Encounter for immunization: Secondary | ICD-10-CM

## 2019-06-24 NOTE — Progress Notes (Signed)
   Covid-19 Vaccination Clinic  Name:  Jairy Angulo    MRN: [id number] DOB: 1960/09/20  06/24/2019  Ms. Benkert was observed post Covid-19 immunization for 15 minutes without incident. She was provided with Vaccine Information Sheet and instruction to access the V-Safe system.   Ms. Niblack was instructed to call 911 with any severe reactions post vaccine: Marland Kitchen Difficulty breathing  . Swelling of face and throat  . A fast heartbeat  . A bad rash all over body  . Dizziness and weakness   Immunizations Administered    Name Date Dose VIS Date Route   Pfizer COVID-19 Vaccine 06/24/2019  2:13 PM 0.3 mL 02/21/2019 Intramuscular   Manufacturer: [employer], [employer]   Lot: [id number]   NDC: [address]-4

## 2019-06-25 ENCOUNTER — Encounter: Payer: Self-pay | Admitting: Family Medicine

## 2019-06-25 ENCOUNTER — Ambulatory Visit (HOSPITAL_BASED_OUTPATIENT_CLINIC_OR_DEPARTMENT_OTHER)
Admission: RE | Admit: 2019-06-25 | Discharge: 2019-06-25 | Disposition: A | Discharge status: Home / Self Care | Payer: Self-pay | Source: Ambulatory Visit | Attending: Family Medicine | Admitting: Family Medicine

## 2019-06-25 ENCOUNTER — Ambulatory Visit (INDEPENDENT_AMBULATORY_CARE_PROVIDER_SITE_OTHER): Payer: Self-pay | Admitting: Family Medicine

## 2019-06-25 ENCOUNTER — Other Ambulatory Visit: Payer: Self-pay

## 2019-06-25 VITALS — BP 132/82 | HR 67 | Ht 62.0 in | Wt 198.0 lb

## 2019-06-25 DIAGNOSIS — S93401A Sprain of unspecified ligament of right ankle, initial encounter: Secondary | ICD-10-CM

## 2019-06-25 DIAGNOSIS — S82832A Other fracture of upper and lower end of left fibula, initial encounter for closed fracture: Secondary | ICD-10-CM

## 2019-06-25 DIAGNOSIS — M1711 Unilateral primary osteoarthritis, right knee: Secondary | ICD-10-CM

## 2019-06-25 NOTE — Patient Instructions (Signed)
Nice to meet you Please try ice  Please try the range of motion  Please try the rolling walker  I will call with the xray results  Please send me a message in MyChart with any questions or updates.  Please see me back in 2 weeks.   --Dr. Jordan Likes

## 2019-06-25 NOTE — Progress Notes (Signed)
Monica Lynch - 59 y.o. female MRN [id number]  Date of birth: 1960/03/21  SUBJECTIVE:  Including CC & ROS.  Chief Complaint  Patient presents with  . Knee Injury    right knee x 06/22/2019  . Ankle Injury    left ankle x 06/22/2019    Monica Lynch is a 59 y.o. female that is presenting with right knee pain, right ankle pain and left ankle fracture.  She had a fall off of a ladder recently.  She is unsure of the mechanism of how she landed.  She was seen in the emergency department and placed in a ankle brace.  She is having swelling and pain over the lateral malleolus.  She has history of degenerative changes of the right knee and that seems to be hurting as well.  The right ankle has had a history of surgery and is hurting in the anterior aspect of the joint.  Denies any numbness or tingling.  Has been using crutches up with ambulation.  Independent independent review of the right knee x-ray from 4/11 shows moderate to severe medial joint space narrowing. Independent review of the left ankle x-ray from 4/11 shows calcific changes at the Achilles as well as a posterior and plantar calcaneal spur.  Revealing for a nondisplaced distal fibular fracture  Review of Systems See HPI   HISTORY: Past Medical, Surgical, Social, and Family History Reviewed & Updated per EMR.   Pertinent Historical Findings include:  Past Medical History:  Diagnosis Date  . Asthma   . Cataract    current early stage  . Essential hypertension 12/01/2016  . Osteoarthritis   . Primary osteoarthritis of right knee 11/07/2013  . Seasonal allergies     Past Surgical History:  Procedure Laterality Date  . CORONARY ARTERY BYPASS GRAFT N/A 12/12/2016   Procedure: CORONARY ARTERY BYPASS GRAFTING (CABG) x four , using left internal mammary artery and right leg greater saphenous vein harvested endoscopically;  Surgeon: Kerin Perna, MD;  Location: [facility] OR;  Service: Open Heart Surgery;  Laterality: N/A;  . LEFT  HEART CATH AND CORONARY ANGIOGRAPHY N/A 12/11/2016   Procedure: LEFT HEART CATH AND CORONARY ANGIOGRAPHY;  Surgeon: Yvonne Kendall, MD;  Location: [facility];  Service: Cardiovascular;  Laterality: N/A;  . screws removed from ankles    . TEE WITHOUT CARDIOVERSION N/A 12/12/2016   Procedure: TRANSESOPHAGEAL ECHOCARDIOGRAM (TEE);  Surgeon: Donata Clay, Theron Arista, MD;  Location: [facility] OR;  Service: Open Heart Surgery;  Laterality: N/A;  . TUBAL LIGATION      Family History  Problem Relation Age of Onset  . Heart disease Mother   . Hypertension Mother   . Hyperlipidemia Mother   . COPD Father   . Hyperlipidemia Father   . Heart attack Brother 42  . Hyperlipidemia Maternal Grandfather     Social History   Socioeconomic History  . Marital status: Married    Spouse name: Not on file  . Number of children: Not on file  . Years of education: Not on file  . Highest education level: Not on file  Occupational History  . Not on file  Tobacco Use  . Smoking status: Never Smoker  . Smokeless tobacco: Never Used  Substance and Sexual Activity  . Alcohol use: Yes    Comment: occasional  . Drug use: No  . Sexual activity: Not on file  Other Topics Concern  . Not on file  Social History Narrative  . Not on file   Social  Determinants of Health   Financial Resource Strain:   . Difficulty of Paying Living Expenses:   Food Insecurity:   . Worried About Charity fundraiser in the Last Year:   . Arboriculturist in the Last Year:   Transportation Needs:   . Film/video editor (Medical):   Marland Kitchen Lack of Transportation (Non-Medical):   Physical Activity:   . Days of Exercise per Week:   . Minutes of Exercise per Session:   Stress:   . Feeling of Stress :   Social Connections:   . Frequency of Communication with Friends and Family:   . Frequency of Social Gatherings with Friends and Family:   . Attends Religious Services:   . Active Member of Clubs or Organizations:   . Attends English as a second language teacher Meetings:   Marland Kitchen Marital Status:   Intimate Partner Violence:   . Fear of Current or Ex-Partner:   . Emotionally Abused:   Marland Kitchen Physically Abused:   . Sexually Abused:      PHYSICAL EXAM:  VS: BP 132/82   Pulse 67   Ht 5\' 2"  (1.575 m)   Wt 198 lb (89.8 kg)   BMI 36.21 kg/m  Physical Exam Gen: NAD, alert, cooperative with exam, well-appearing MSK:  Left ankle: Moderate effusion and ecchymosis occurring over the lateral dorsal compartment of the leg and foot. Tenderness to palpation over the distal fibula. Limited plantarflexion and dorsiflexion. Negative anterior drawer. Right knee: No effusion. Tenderness palpation over the medial joint line. Normal range of motion. Right ankle: No ecchymosis or swelling. Normal range of motion. Negative anterior drawer. Some tenderness palpation of the ankle joint. Neurovascularly intact     ASSESSMENT & PLAN:   Primary osteoarthritis of right knee Imaging of the knee shows significant degenerative changes that are likely exacerbated with the fall. -Counseled on home exercise therapy and supportive care. -Could consider physical therapy injection if needed.  Closed fracture of left distal fibula Injury occurred on 4/11. -Cam walker.   -Counseled on home exercise therapy and supportive care. -Can repeat imaging at follow-up.  Sprain of right ankle Having pain since the fall her right ankle and has a history of surgery.  Seems more like a sprain and less likely of a fracture. -Counseled on home exercise therapy and supportive care. -X-ray. -Can try the ASO brace from the left  - could consider PT

## 2019-06-26 ENCOUNTER — Telehealth: Payer: Self-pay | Admitting: Family Medicine

## 2019-06-26 DIAGNOSIS — S82839A Other fracture of upper and lower end of unspecified fibula, initial encounter for closed fracture: Secondary | ICD-10-CM | POA: Insufficient documentation

## 2019-06-26 DIAGNOSIS — S82832A Other fracture of upper and lower end of left fibula, initial encounter for closed fracture: Secondary | ICD-10-CM | POA: Insufficient documentation

## 2019-06-26 NOTE — Assessment & Plan Note (Signed)
Imaging of the knee shows significant degenerative changes that are likely exacerbated with the fall. -Counseled on home exercise therapy and supportive care. -Could consider physical therapy injection if needed.

## 2019-06-26 NOTE — Assessment & Plan Note (Signed)
Having pain since the fall her right ankle and has a history of surgery.  Seems more like a sprain and less likely of a fracture. -Counseled on home exercise therapy and supportive care. -X-ray. -Can try the ASO brace from the left  - could consider PT

## 2019-06-26 NOTE — Telephone Encounter (Signed)
Left VM for patient. If she calls back please have her speak with a nurse/CMA and inform that her right ankle is also showing a fracture. We can try an aircast on her right ankle to help with the fracture.   If any questions then please take the best time and phone number to call and I will try to call her back.   Myra Rude, MD [facility] 06/26/2019, 2:50 PM

## 2019-06-26 NOTE — Assessment & Plan Note (Signed)
Injury occurred on 4/11. -Cam walker.   -Counseled on home exercise therapy and supportive care. -Can repeat imaging at follow-up.

## 2019-07-09 ENCOUNTER — Ambulatory Visit (INDEPENDENT_AMBULATORY_CARE_PROVIDER_SITE_OTHER): Payer: Self-pay | Admitting: Family Medicine

## 2019-07-09 ENCOUNTER — Ambulatory Visit (HOSPITAL_BASED_OUTPATIENT_CLINIC_OR_DEPARTMENT_OTHER)
Admission: RE | Admit: 2019-07-09 | Discharge: 2019-07-09 | Disposition: A | Discharge status: Home / Self Care | Payer: Self-pay | Source: Ambulatory Visit | Attending: Family Medicine | Admitting: Family Medicine

## 2019-07-09 ENCOUNTER — Other Ambulatory Visit: Payer: Self-pay

## 2019-07-09 ENCOUNTER — Encounter: Payer: Self-pay | Admitting: Family Medicine

## 2019-07-09 VITALS — BP 150/89 | HR 61 | Ht 62.0 in | Wt 198.0 lb

## 2019-07-09 DIAGNOSIS — S82832D Other fracture of upper and lower end of left fibula, subsequent encounter for closed fracture with routine healing: Secondary | ICD-10-CM

## 2019-07-09 DIAGNOSIS — S82831A Other fracture of upper and lower end of right fibula, initial encounter for closed fracture: Secondary | ICD-10-CM

## 2019-07-09 DIAGNOSIS — S82831D Other fracture of upper and lower end of right fibula, subsequent encounter for closed fracture with routine healing: Secondary | ICD-10-CM

## 2019-07-09 NOTE — Progress Notes (Signed)
Monica Lynch - 59 y.o. female MRN [id number]  Date of birth: 1960-07-05  SUBJECTIVE:  Including CC & ROS.  Chief Complaint  Patient presents with  . Follow-up    follow up for bilateral ankle    Monica Lynch is a 59 y.o. female that is following up for right ankle fracture and left ankle fracture.  Initial injury was on 4/11.  She is placed in a Aircast on the right and a cam walker on the left.  Left ankle seems more painful and swollen.  She denies any pain with the right ankle.  She is getting around a little bit better.  Denies any numbness or tingling.   Review of Systems See HPI   HISTORY: Past Medical, Surgical, Social, and Family History Reviewed & Updated per EMR.   Pertinent Historical Findings include:  Past Medical History:  Diagnosis Date  . Asthma   . Cataract    current early stage  . Essential hypertension 12/01/2016  . Osteoarthritis   . Primary osteoarthritis of right knee 11/07/2013  . Seasonal allergies     Past Surgical History:  Procedure Laterality Date  . CORONARY ARTERY BYPASS GRAFT N/A 12/12/2016   Procedure: CORONARY ARTERY BYPASS GRAFTING (CABG) x four , using left internal mammary artery and right leg greater saphenous vein harvested endoscopically;  Surgeon: Ivin Poot, MD;  Location: [place];  Service: Open Heart Surgery;  Laterality: N/A;  . LEFT HEART CATH AND CORONARY ANGIOGRAPHY N/A 12/11/2016   Procedure: LEFT HEART CATH AND CORONARY ANGIOGRAPHY;  Surgeon: Nelva Bush, MD;  Location: [facility];  Service: Cardiovascular;  Laterality: N/A;  . screws removed from ankles    . TEE WITHOUT CARDIOVERSION N/A 12/12/2016   Procedure: TRANSESOPHAGEAL ECHOCARDIOGRAM (TEE);  Surgeon: Prescott Gum, Collier Salina, MD;  Location: [place];  Service: Open Heart Surgery;  Laterality: N/A;  . TUBAL LIGATION      Family History  Problem Relation Age of Onset  . Heart disease Mother   . Hypertension Mother   . Hyperlipidemia Mother   . COPD Father     . Hyperlipidemia Father   . Heart attack Brother 75  . Hyperlipidemia Maternal Grandfather     Social History   Socioeconomic History  . Marital status: Married    Spouse name: Not on file  . Number of children: Not on file  . Years of education: Not on file  . Highest education level: Not on file  Occupational History  . Not on file  Tobacco Use  . Smoking status: Never Smoker  . Smokeless tobacco: Never Used  Substance and Sexual Activity  . Alcohol use: Yes    Comment: occasional  . Drug use: No  . Sexual activity: Not on file  Other Topics Concern  . Not on file  Social History Narrative  . Not on file   Social Determinants of Health   Financial Resource Strain:   . Difficulty of Paying Living Expenses:   Food Insecurity:   . Worried About Charity fundraiser in the Last Year:   . Arboriculturist in the Last Year:   Transportation Needs:   . Film/video editor (Medical):   Marland Kitchen Lack of Transportation (Non-Medical):   Physical Activity:   . Days of Exercise per Week:   . Minutes of Exercise per Session:   Stress:   . Feeling of Stress :   Social Connections:   . Frequency of Communication with Friends and Family:   .  Frequency of Social Gatherings with Friends and Family:   . Attends Religious Services:   . Active Member of Clubs or Organizations:   . Attends Banker Meetings:   Marland Kitchen Marital Status:   Intimate Partner Violence:   . Fear of Current or Ex-Partner:   . Emotionally Abused:   Marland Kitchen Physically Abused:   . Sexually Abused:      PHYSICAL EXAM:  VS: BP (!) 150/89   Pulse 61   Ht 5\' 2"  (1.575 m)   Wt 198 lb (89.8 kg)   BMI 36.21 kg/m  Physical Exam Gen: NAD, alert, cooperative with exam, well-appearing MSK:  Right ankle: No obvious swelling or ecchymosis. Normal range of motion. No tenderness to palpation over the distal fibula. Normal strength resistance. Left ankle. Swelling of the lateral malleolus present. No  significant ecchymosis. Tenderness to palpation over the ATFL. Negative anterior drawer. Neurovascularly intact     ASSESSMENT & PLAN:   Closed fracture of left distal fibula Still having some swelling and pain over the lateral aspect.  It is more troublesome than the right.  Initial injury was on 4/11.  Has been in the cam walker. -Counseled on home exercise therapy and supportive care. -Continue cam walker. -Follow-up in 4 weeks.  Would reimage at that time.  Closed fracture of distal fibula Fracture was observed on x-ray from her previous visit.  She denies any significant complaints now or on her initial visit.  She has good range of motion little to no pain.  Has been doing well in the Aircast. -Continue Aircast. -Counseled on home exercise therapy and supportive care. -X-ray. -Follow-up in 4 weeks.

## 2019-07-09 NOTE — Assessment & Plan Note (Signed)
Fracture was observed on x-ray from her previous visit.  She denies any significant complaints now or on her initial visit.  She has good range of motion little to no pain.  Has been doing well in the Aircast. -Continue Aircast. -Counseled on home exercise therapy and supportive care. -X-ray. -Follow-up in 4 weeks.

## 2019-07-09 NOTE — Assessment & Plan Note (Signed)
Still having some swelling and pain over the lateral aspect.  It is more troublesome than the right.  Initial injury was on 4/11.  Has been in the cam walker. -Counseled on home exercise therapy and supportive care. -Continue cam walker. -Follow-up in 4 weeks.  Would reimage at that time.

## 2019-07-09 NOTE — Patient Instructions (Signed)
Good to see you Please try ice  Please continue the splint and boot  Please try the range of motion movements.  I will call with the xray results from today   Please send me a message in MyChart with any questions or updates.  Please see me back in 4 weeks.   --Dr. Jordan Likes

## 2019-07-10 ENCOUNTER — Telehealth: Payer: Self-pay | Admitting: Family Medicine

## 2019-07-10 NOTE — Telephone Encounter (Signed)
Left VM for patient. If she calls back please have her speak with a nurse/CMA and inform that her xray doesn't show the fracture. It is possible that it may have healed or the angle of the xray didn't pick it up. Will continue with aircast.   If any questions then please take the best time and phone number to call and I will try to call her back.   Myra Rude, MD [facility] 07/10/2019, 9:31 AM

## 2019-07-24 IMAGING — DX DG CHEST 1V PORT
1 series · 1 of 1 positions shown · non-contrast
Comparison: Chest x-ray 12/13/2016.

CLINICAL DATA: 56-year-old female with history of CABG.

EXAM:
PORTABLE CHEST 1 VIEW

[chest ap]
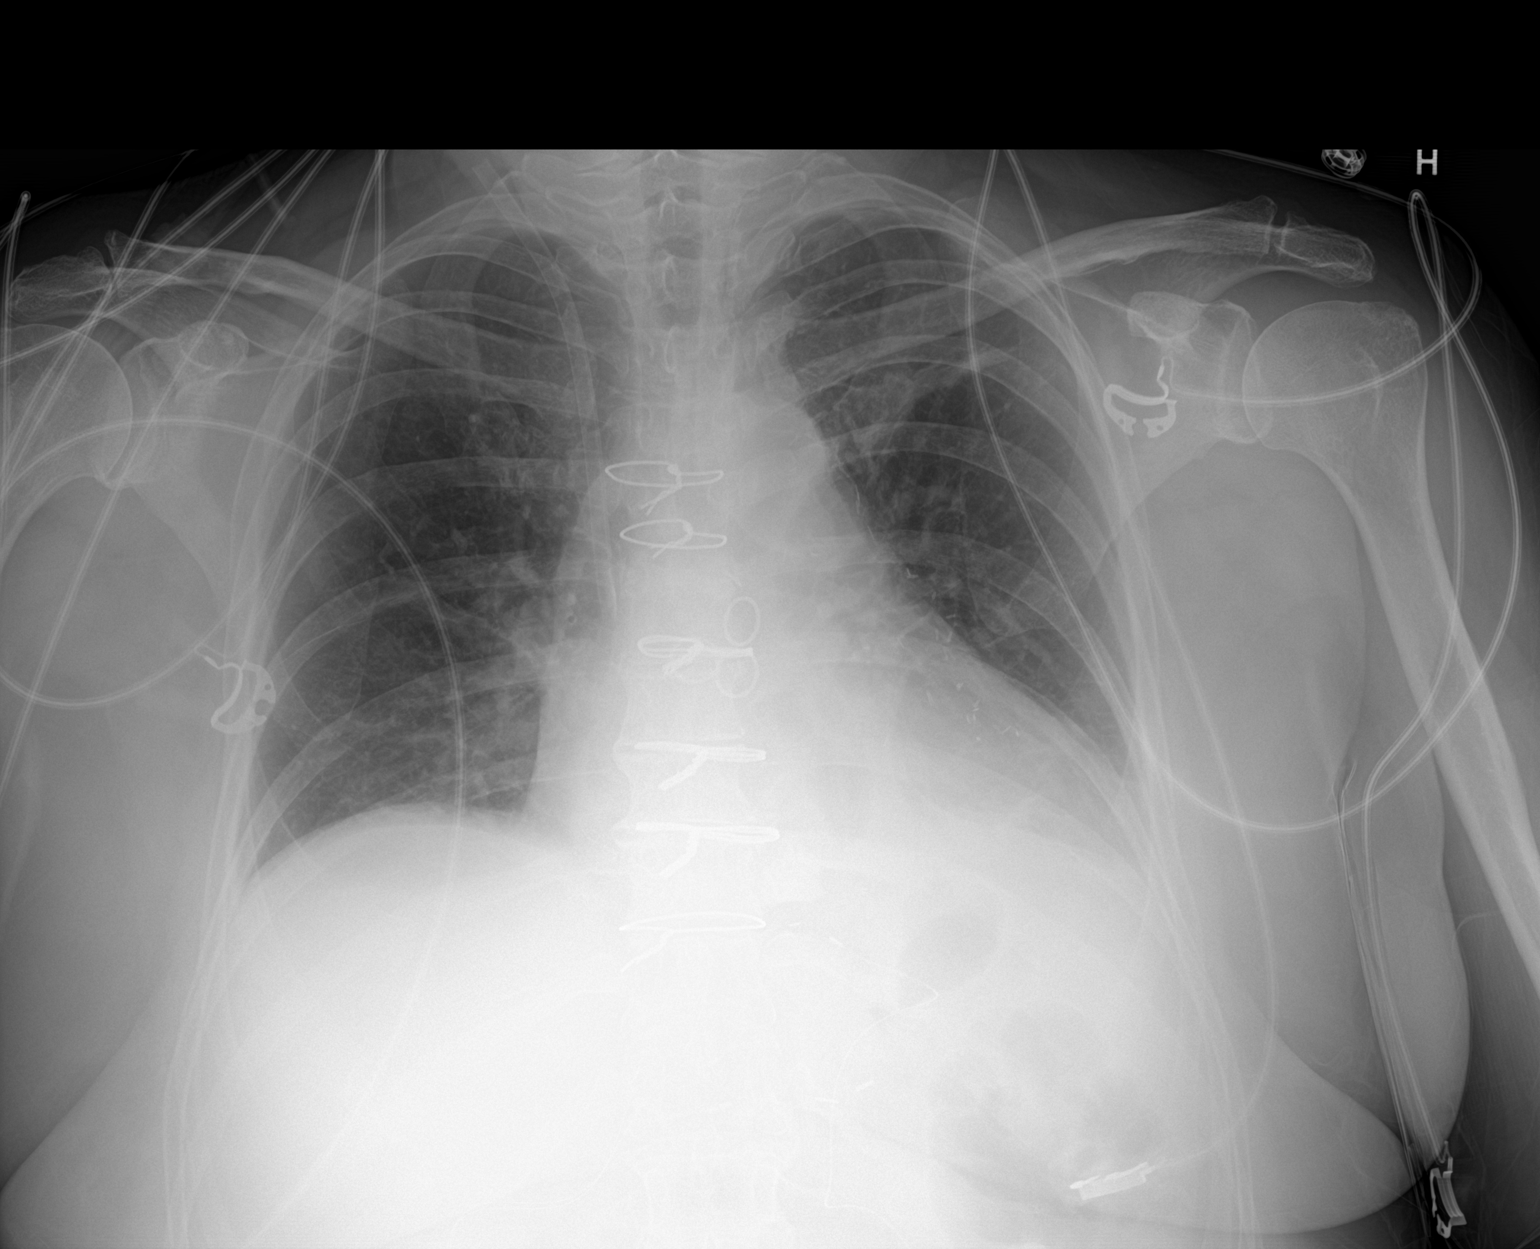

[1 of 1 positions shown; findings below may reference images not displayed]

FINDINGS: Previously noted Swan-Ganz catheter has been removed. Right IJ
Cordis with tip terminating at the superior cavoatrial junction is
unchanged. Previously noted mediastinal drain has been removed.
Previously noted left-sided chest tube has been removed. No
appreciable pneumothorax. Lung volumes are low. Opacities at the
left base partially obscuring the left hemidiaphragm likely reflect
resolving areas of postoperative subsegmental atelectasis with
superimposed small left pleural effusion. Right lung is clear. No
right pleural effusion. No evidence of pulmonary edema. Mild
enlargement of the cardiopericardial silhouette. Upper mediastinal
contours are within normal limits. Status post median sternotomy for
CABG including [REDACTED]. Epicardial pacing wires.
IMPRESSION: 1. Postoperative changes and support apparatus, as above.
2. Low lung volumes with resolving subsegmental atelectasis in the
left lower lobe with superimposed small left pleural effusion.

## 2019-07-25 IMAGING — DX DG CHEST 1V PORT
1 series · 1 of 1 positions shown · non-contrast
Comparison: 12/14/2016

CLINICAL DATA: History of coronary bypass grafting

EXAM:
PORTABLE CHEST 1 VIEW

[chest]
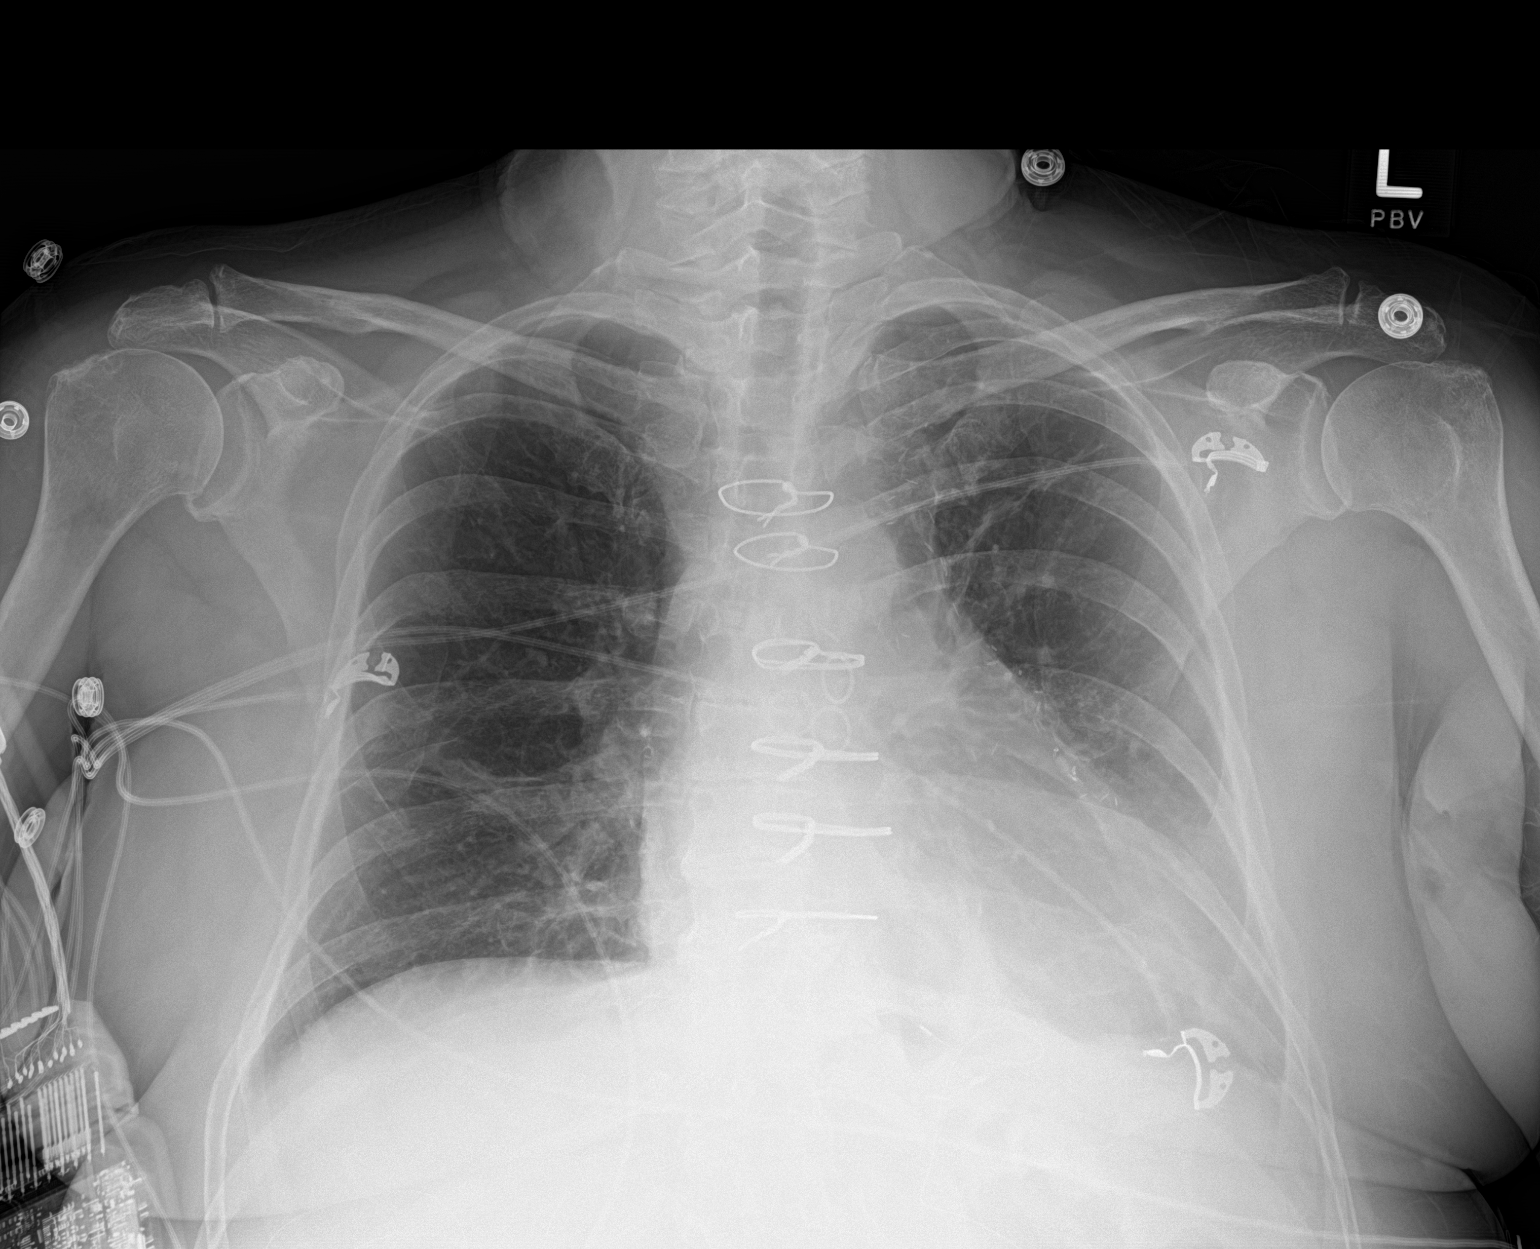

[1 of 1 positions shown; findings below may reference images not displayed]

FINDINGS: Cardiac shadow is at the upper limits of normal in size.
Postsurgical changes are again seen. Right jugular sheath has been
removed in the interval. No pneumothorax is seen. Minimal left
retrocardiac atelectasis is noted stable from the prior study. No
new focal abnormality is noted.
IMPRESSION: No acute abnormality seen.  Stable left basilar atelectasis is seen.

## 2019-08-06 ENCOUNTER — Encounter: Payer: Self-pay | Admitting: Family Medicine

## 2019-08-06 ENCOUNTER — Ambulatory Visit (HOSPITAL_BASED_OUTPATIENT_CLINIC_OR_DEPARTMENT_OTHER)
Admission: RE | Admit: 2019-08-06 | Discharge: 2019-08-06 | Disposition: A | Discharge status: Home / Self Care | Payer: Self-pay | Source: Ambulatory Visit | Attending: Family Medicine | Admitting: Family Medicine

## 2019-08-06 ENCOUNTER — Ambulatory Visit (INDEPENDENT_AMBULATORY_CARE_PROVIDER_SITE_OTHER): Payer: Self-pay | Admitting: Family Medicine

## 2019-08-06 ENCOUNTER — Other Ambulatory Visit: Payer: Self-pay

## 2019-08-06 VITALS — BP 147/88 | HR 59 | Ht 62.0 in | Wt 195.0 lb

## 2019-08-06 DIAGNOSIS — S82831D Other fracture of upper and lower end of right fibula, subsequent encounter for closed fracture with routine healing: Secondary | ICD-10-CM

## 2019-08-06 DIAGNOSIS — S82832D Other fracture of upper and lower end of left fibula, subsequent encounter for closed fracture with routine healing: Secondary | ICD-10-CM

## 2019-08-06 NOTE — Patient Instructions (Signed)
Good to see you Please try to wean out of the boot Please continue ice and elevation. Please continue the exercises    Please send me a message in MyChart with any questions or updates.  Please see me back in 4 weeks.   --Dr. Jordan Likes

## 2019-08-06 NOTE — Assessment & Plan Note (Signed)
Initial injury on 4/11.  Mild pain on palpation today.  Is able to stand with minimal pain. -Counseled on home exercise therapy and supportive care. -X-ray. -Counseled on weaning out of the Cam walker. -Follow-up in 4 weeks.

## 2019-08-06 NOTE — Progress Notes (Signed)
Monica Lynch - 59 y.o. female MRN [id number]  Date of birth: 20-Jan-1961  SUBJECTIVE:  Including CC & ROS.  Chief Complaint  Patient presents with  . Follow-up    bilateral ankle    Monica Lynch is a 59 y.o. female that is following up for her right ankle pain and her left ankle pain.  Initially x-rays demonstrated a fracture of the distal fibula on the right subsequent x-rays were negative.  She has continued swelling of the left lateral malleolus.  Pain is intermittent in nature.  Has some mild pain with outpatient.  She is continuing the cam walker for about 6 weeks now.   Review of Systems See HPI   HISTORY: Past Medical, Surgical, Social, and Family History Reviewed & Updated per EMR.   Pertinent Historical Findings include:  Past Medical History:  Diagnosis Date  . Asthma   . Cataract    current early stage  . Essential hypertension 12/01/2016  . Osteoarthritis   . Primary osteoarthritis of right knee 11/07/2013  . Seasonal allergies     Past Surgical History:  Procedure Laterality Date  . CORONARY ARTERY BYPASS GRAFT N/A 12/12/2016   Procedure: CORONARY ARTERY BYPASS GRAFTING (CABG) x four , using left internal mammary artery and right leg greater saphenous vein harvested endoscopically;  Surgeon: Kerin Perna, MD;  Location: [facility] OR;  Service: Open Heart Surgery;  Laterality: N/A;  . LEFT HEART CATH AND CORONARY ANGIOGRAPHY N/A 12/11/2016   Procedure: LEFT HEART CATH AND CORONARY ANGIOGRAPHY;  Surgeon: Yvonne Kendall, MD;  Location: [facility];  Service: Cardiovascular;  Laterality: N/A;  . screws removed from ankles    . TEE WITHOUT CARDIOVERSION N/A 12/12/2016   Procedure: TRANSESOPHAGEAL ECHOCARDIOGRAM (TEE);  Surgeon: Donata Clay, Theron Arista, MD;  Location: [facility] OR;  Service: Open Heart Surgery;  Laterality: N/A;  . TUBAL LIGATION      Family History  Problem Relation Age of Onset  . Heart disease Mother   . Hypertension Mother   . Hyperlipidemia Mother     . COPD Father   . Hyperlipidemia Father   . Heart attack Brother 42  . Hyperlipidemia Maternal Grandfather     Social History   Socioeconomic History  . Marital status: Married    Spouse name: Not on file  . Number of children: Not on file  . Years of education: Not on file  . Highest education level: Not on file  Occupational History  . Not on file  Tobacco Use  . Smoking status: Never Smoker  . Smokeless tobacco: Never Used  Substance and Sexual Activity  . Alcohol use: Yes    Comment: occasional  . Drug use: No  . Sexual activity: Not on file  Other Topics Concern  . Not on file  Social History Narrative  . Not on file   Social Determinants of Health   Financial Resource Strain:   . Difficulty of Paying Living Expenses:   Food Insecurity:   . Worried About Programme researcher, broadcasting/film/video in the Last Year:   . Barista in the Last Year:   Transportation Needs:   . Freight forwarder (Medical):   Marland Kitchen Lack of Transportation (Non-Medical):   Physical Activity:   . Days of Exercise per Week:   . Minutes of Exercise per Session:   Stress:   . Feeling of Stress :   Social Connections:   . Frequency of Communication with Friends and Family:   . Frequency  of Social Gatherings with Friends and Family:   . Attends Religious Services:   . Active Member of Clubs or Organizations:   . Attends Archivist Meetings:   Marland Kitchen Marital Status:   Intimate Partner Violence:   . Fear of Current or Ex-Partner:   . Emotionally Abused:   Marland Kitchen Physically Abused:   . Sexually Abused:      PHYSICAL EXAM:  VS: BP (!) 147/88   Pulse (!) 59   Ht 5\' 2"  (1.575 m)   Wt 195 lb (88.5 kg)   BMI 35.67 kg/m  Physical Exam Gen: NAD, alert, cooperative with exam, well-appearing MSK:  Right ankle: No ecchymosis or swelling. Normal range of motion to palpation. Left ankle: Mild swelling over the lateral malleolus. Tenderness to palpation over the lateral malleolus. No  ecchymosis. Normal range of motion. Normal strength resistance. Neurovascularly intact     ASSESSMENT & PLAN:   Closed fracture of left distal fibula Initial injury on 4/11.  Mild pain on palpation today.  Is able to stand with minimal pain. -Counseled on home exercise therapy and supportive care. -X-ray. -Counseled on weaning out of the Cam walker. -Follow-up in 4 weeks.  Closed fracture of distal fibula Has not missed any pain since the initial fall.  Previous imaging did not show fracture. -Counseled on home exercise therapy and supportive care. -Follow-up as needed.

## 2019-08-06 NOTE — Assessment & Plan Note (Signed)
Has not missed any pain since the initial fall.  Previous imaging did not show fracture. -Counseled on home exercise therapy and supportive care. -Follow-up as needed.

## 2019-08-07 ENCOUNTER — Telehealth: Payer: Self-pay | Admitting: Family Medicine

## 2019-08-07 NOTE — Telephone Encounter (Signed)
Informed of results. Counseled on cam walker.   Myra Rude, MD [facility] 08/07/2019, 9:56 AM

## 2019-09-03 ENCOUNTER — Other Ambulatory Visit: Payer: Self-pay

## 2019-09-03 ENCOUNTER — Encounter: Payer: Self-pay | Admitting: Family Medicine

## 2019-09-03 ENCOUNTER — Ambulatory Visit (INDEPENDENT_AMBULATORY_CARE_PROVIDER_SITE_OTHER): Payer: Self-pay | Admitting: Family Medicine

## 2019-09-03 ENCOUNTER — Ambulatory Visit (HOSPITAL_BASED_OUTPATIENT_CLINIC_OR_DEPARTMENT_OTHER)
Admission: RE | Admit: 2019-09-03 | Discharge: 2019-09-03 | Disposition: A | Discharge status: Home / Self Care | Payer: Self-pay | Source: Ambulatory Visit | Attending: Family Medicine | Admitting: Family Medicine

## 2019-09-03 VITALS — BP 114/76 | HR 52 | Ht 62.0 in | Wt 195.0 lb

## 2019-09-03 DIAGNOSIS — S82832D Other fracture of upper and lower end of left fibula, subsequent encounter for closed fracture with routine healing: Secondary | ICD-10-CM | POA: Insufficient documentation

## 2019-09-03 NOTE — Assessment & Plan Note (Signed)
Injury occurred on 4/11.  Has good strength and range of motion today. -Counseled on home exercise therapy and supportive care. -X-ray. -Could consider physical therapy.

## 2019-09-03 NOTE — Patient Instructions (Signed)
Good to see you Please continue ice as needed  Please try the exercises  Please continue to wean out of the boot  I will call with the results from today   Please send me a message in MyChart with any questions or updates.  Please see [place] back as needed.   --Dr. Jordan Likes

## 2019-09-03 NOTE — Progress Notes (Signed)
Monica Lynch - 59 y.o. female MRN [id number]  Date of birth: 04-12-1960  SUBJECTIVE:  Including CC & ROS.  Chief Complaint  Patient presents with  . Follow-up    left foot    Monica Lynch is a 59 y.o. female that is following up for left ankle fracture.  She denies any significant pain.  Does have pain intermittently.  Uses the cam walker when she is out walking outside of the house.  Denies any giving way.  Has good range of motion.   Review of Systems See HPI   HISTORY: Past Medical, Surgical, Social, and Family History Reviewed & Updated per EMR.   Pertinent Historical Findings include:  Past Medical History:  Diagnosis Date  . Asthma   . Cataract    current early stage  . Essential hypertension 12/01/2016  . Osteoarthritis   . Primary osteoarthritis of right knee 11/07/2013  . Seasonal allergies     Past Surgical History:  Procedure Laterality Date  . CORONARY ARTERY BYPASS GRAFT N/A 12/12/2016   Procedure: CORONARY ARTERY BYPASS GRAFTING (CABG) x four , using left internal mammary artery and right leg greater saphenous vein harvested endoscopically;  Surgeon: Ivin Poot, MD;  Location: [facility];  Service: Open Heart Surgery;  Laterality: N/A;  . LEFT HEART CATH AND CORONARY ANGIOGRAPHY N/A 12/11/2016   Procedure: LEFT HEART CATH AND CORONARY ANGIOGRAPHY;  Surgeon: Nelva Bush, MD;  Location: [facility];  Service: Cardiovascular;  Laterality: N/A;  . screws removed from ankles    . TEE WITHOUT CARDIOVERSION N/A 12/12/2016   Procedure: TRANSESOPHAGEAL ECHOCARDIOGRAM (TEE);  Surgeon: Prescott Gum, Collier Salina, MD;  Location: [facility];  Service: Open Heart Surgery;  Laterality: N/A;  . TUBAL LIGATION      Family History  Problem Relation Age of Onset  . Heart disease Mother   . Hypertension Mother   . Hyperlipidemia Mother   . COPD Father   . Hyperlipidemia Father   . Heart attack Brother 39  . Hyperlipidemia Maternal Grandfather     Social History    Socioeconomic History  . Marital status: Married    Spouse name: Not on file  . Number of children: Not on file  . Years of education: Not on file  . Highest education level: Not on file  Occupational History  . Not on file  Tobacco Use  . Smoking status: Never Smoker  . Smokeless tobacco: Never Used  Vaping Use  . Vaping Use: Never used  Substance and Sexual Activity  . Alcohol use: Yes    Comment: occasional  . Drug use: No  . Sexual activity: Not on file  Other Topics Concern  . Not on file  Social History Narrative  . Not on file   Social Determinants of Health   Financial Resource Strain:   . Difficulty of Paying Living Expenses:   Food Insecurity:   . Worried About Charity fundraiser in the Last Year:   . Arboriculturist in the Last Year:   Transportation Needs:   . Film/video editor (Medical):   Marland Kitchen Lack of Transportation (Non-Medical):   Physical Activity:   . Days of Exercise per Week:   . Minutes of Exercise per Session:   Stress:   . Feeling of Stress :   Social Connections:   . Frequency of Communication with Friends and Family:   . Frequency of Social Gatherings with Friends and Family:   . Attends Religious Services:   .  Active Member of Clubs or Organizations:   . Attends Banker Meetings:   Marland Kitchen Marital Status:   Intimate Partner Violence:   . Fear of Current or Ex-Partner:   . Emotionally Abused:   Marland Kitchen Physically Abused:   . Sexually Abused:      PHYSICAL EXAM:  VS: BP 114/76   Pulse (!) 52   Ht 5\' 2"  (1.575 m)   Wt 195 lb (88.5 kg)   BMI 35.67 kg/m  Physical Exam Gen: NAD, alert, cooperative with exam, well-appearing MSK:  Left ankle: Mild swelling. Mild tenderness over the distal fibula. Good range of motion. Good strength resistance. Neurovascularly intact     ASSESSMENT & PLAN:   Closed fracture of left distal fibula Injury occurred on 4/11.  Has good strength and range of motion today. -Counseled on  home exercise therapy and supportive care. -X-ray. -Could consider physical therapy.

## 2019-09-04 ENCOUNTER — Telehealth: Payer: Self-pay | Admitting: Family Medicine

## 2019-09-04 NOTE — Telephone Encounter (Signed)
Informed of results.   Myra Rude, MD [facility] 09/04/2019, 5:34 PM

## 2022-02-01 IMAGING — DX DG ANKLE COMPLETE 3+V*R*
3 series · 3 of 3 positions shown · non-contrast
Comparison: None.

CLINICAL DATA: Right ankle pain from right ankle sprain.

EXAM:
RIGHT ANKLE - COMPLETE 3+ VIEW

[ankle ap]
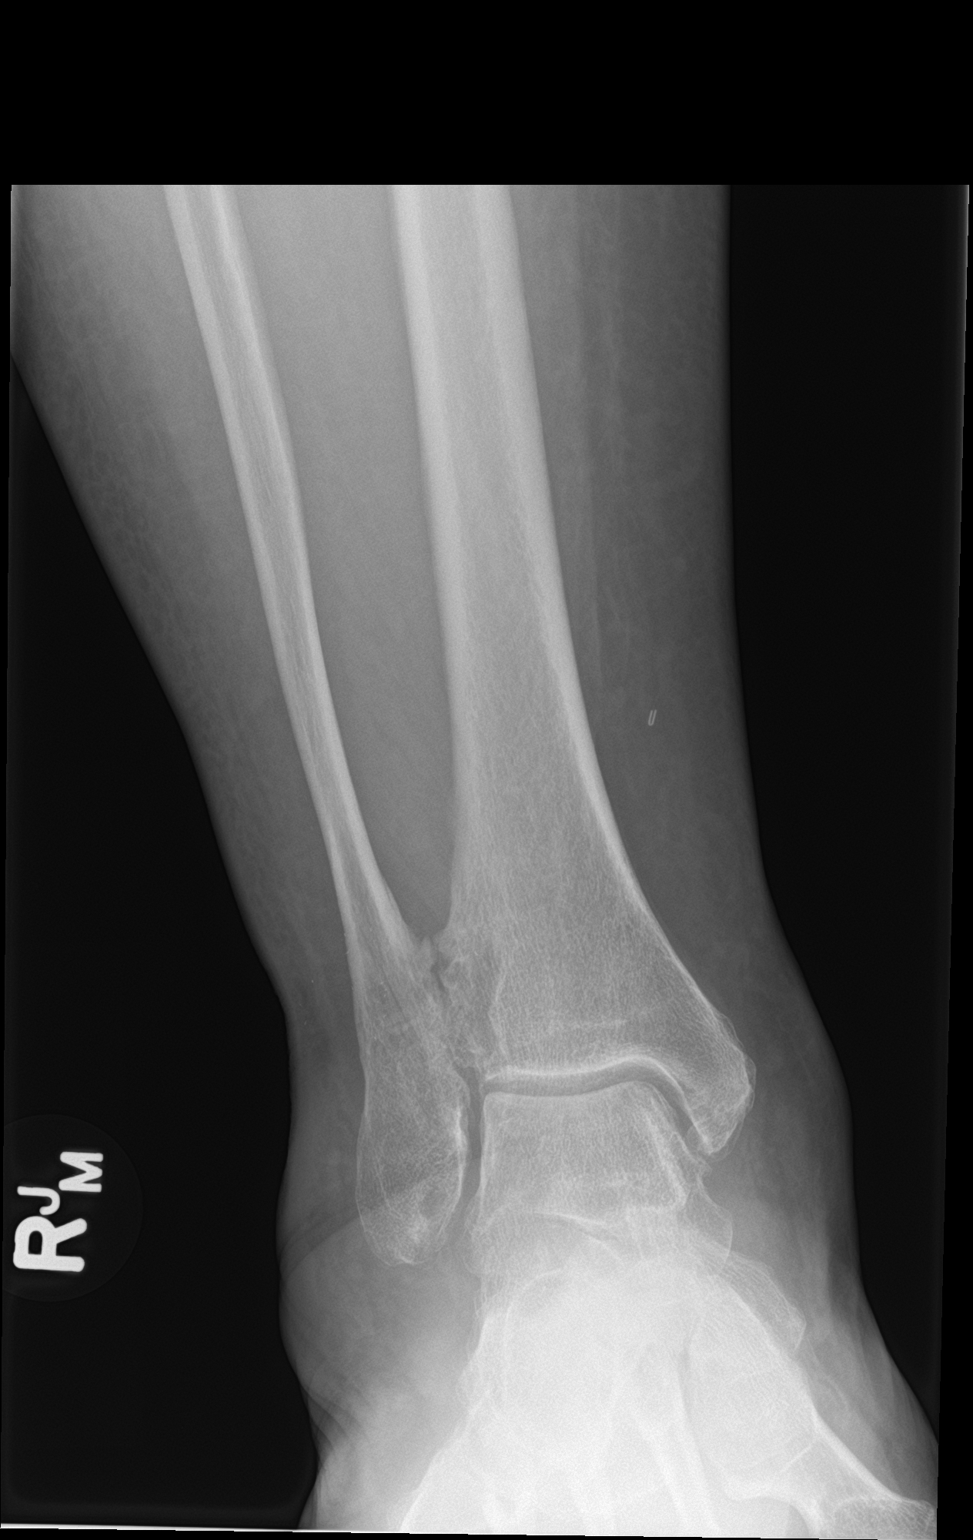

[ankle obl]
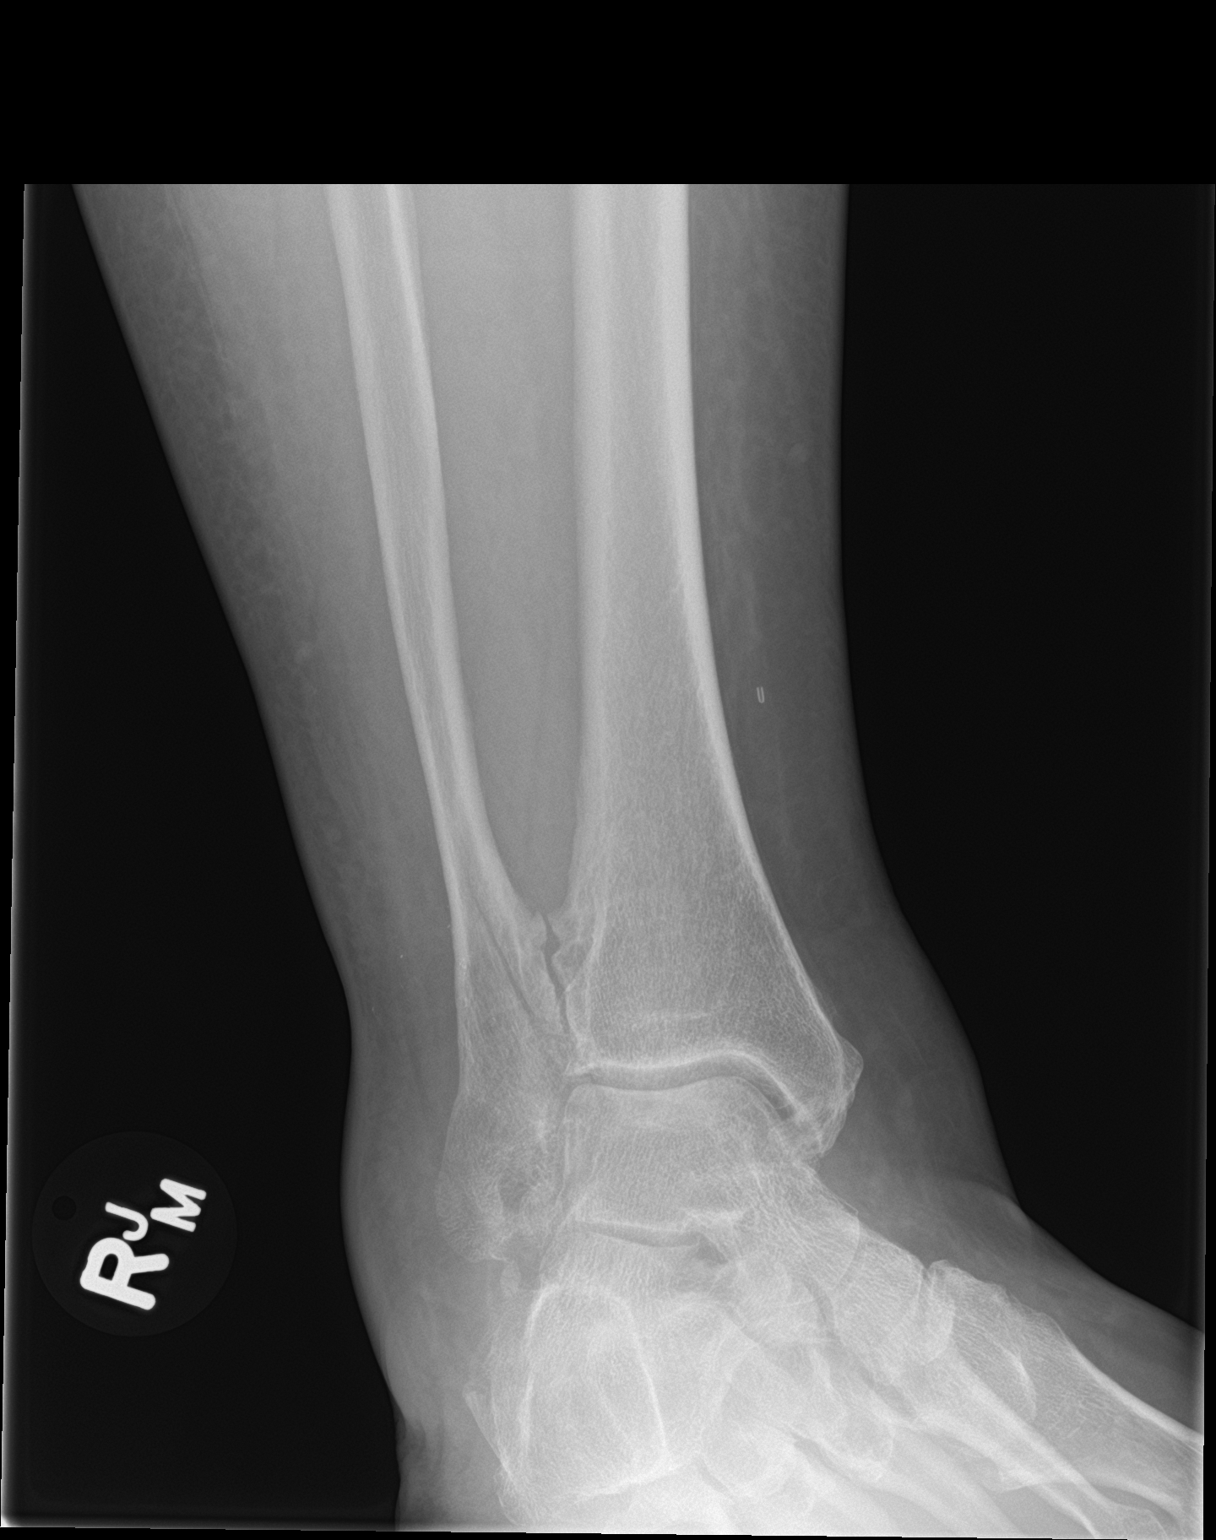

[ankle lat]
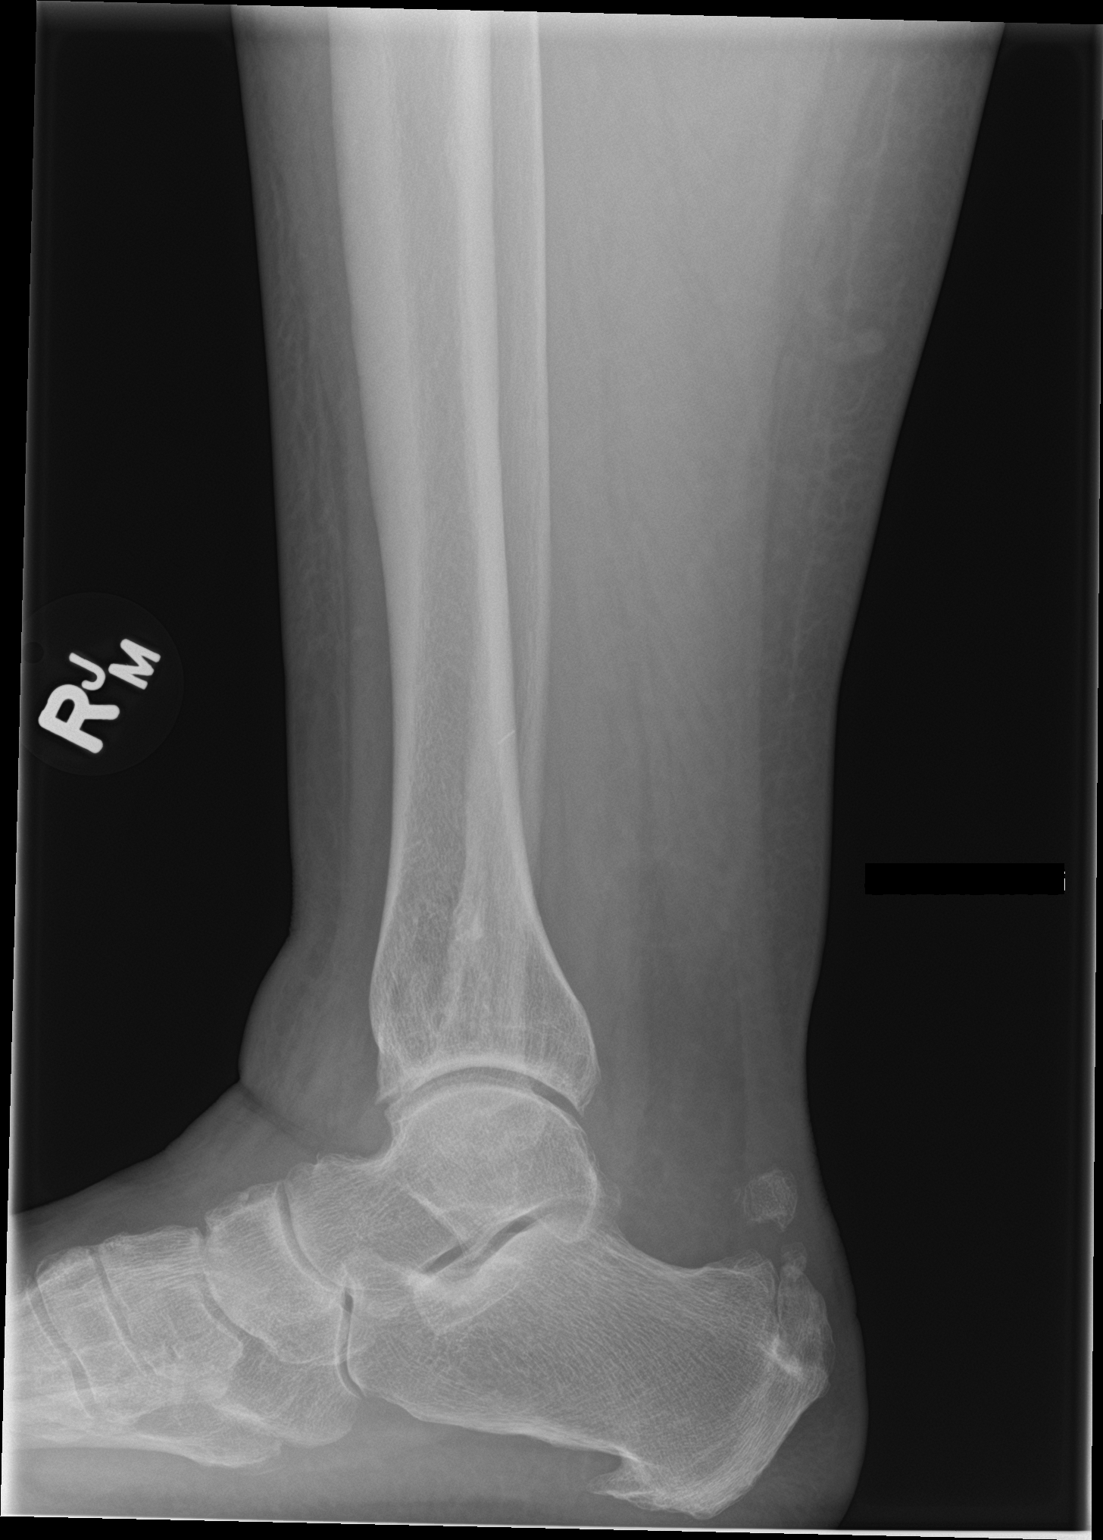

[3 of 3 positions shown; findings below may reference images not displayed]

FINDINGS: Diffuse soft tissue swelling over the ankle. Ankle mortise is
unremarkable. There is an oblique fracture of the distal
diametaphyseal region of the fibula at and above the level of the
ankle mortise. Well corticated bony fragment adjacent the tip of the
fibula likely chronic. Degenerative change over the tibiotalar joint
and midfoot. Prominent posterior and inferior calcaneal spurs.
IMPRESSION: Oblique fracture of the distal fibular diametaphyseal region without
significant displacement.

## 2022-06-28 ENCOUNTER — Encounter: Payer: Self-pay | Admitting: *Deleted
